# Patient Record
Sex: Male | Born: 1975 | Race: White | Hispanic: No | Smoking: Former smoker
Health system: Southern US, Community
[De-identification: ages and names within clinical notes are randomized; demographics above are authoritative.]

## PROBLEM LIST (undated history)

## (undated) DIAGNOSIS — F419 Anxiety disorder, unspecified: Secondary | ICD-10-CM

## (undated) DIAGNOSIS — S0990XA Unspecified injury of head, initial encounter: Secondary | ICD-10-CM

## (undated) DIAGNOSIS — F17200 Nicotine dependence, unspecified, uncomplicated: Secondary | ICD-10-CM

## (undated) DIAGNOSIS — G43009 Migraine without aura, not intractable, without status migrainosus: Secondary | ICD-10-CM

## (undated) DIAGNOSIS — F902 Attention-deficit hyperactivity disorder, combined type: Secondary | ICD-10-CM

## (undated) DIAGNOSIS — N200 Calculus of kidney: Secondary | ICD-10-CM

## (undated) DIAGNOSIS — J302 Other seasonal allergic rhinitis: Secondary | ICD-10-CM

## (undated) DIAGNOSIS — F339 Major depressive disorder, recurrent, unspecified: Secondary | ICD-10-CM

## (undated) HISTORY — DX: Unspecified injury of head, initial encounter: S09.90XA

## (undated) HISTORY — DX: Migraine without aura, not intractable, without status migrainosus: G43.009

## (undated) HISTORY — DX: Anxiety disorder, unspecified: F41.9

## (undated) HISTORY — DX: Other seasonal allergic rhinitis: J30.2

## (undated) HISTORY — DX: Calculus of kidney: N20.0

## (undated) HISTORY — DX: Nicotine dependence, unspecified, uncomplicated: F17.200

## (undated) HISTORY — DX: Major depressive disorder, recurrent, unspecified: F33.9

## (undated) HISTORY — DX: Attention-deficit hyperactivity disorder, combined type: F90.2

---
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
## 2000-05-08 HISTORY — PX: APPENDECTOMY: SHX54

## 2002-05-08 HISTORY — PX: KNEE SURGERY: SHX244

## 2003-01-28 ENCOUNTER — Encounter: Payer: Self-pay | Admitting: Orthopedic Surgery

## 2003-01-28 ENCOUNTER — Ambulatory Visit (HOSPITAL_COMMUNITY): Admission: RE | Admit: 2003-01-28 | Discharge: 2003-01-28 | Payer: Self-pay | Admitting: Orthopedic Surgery

## 2003-02-04 ENCOUNTER — Ambulatory Visit (HOSPITAL_BASED_OUTPATIENT_CLINIC_OR_DEPARTMENT_OTHER): Admission: RE | Admit: 2003-02-04 | Discharge: 2003-02-04 | Payer: Self-pay | Admitting: Orthopedic Surgery

## 2010-09-23 NOTE — Op Note (Signed)
Ray Rosales, Ray Rosales                            ACCOUNT NO.:  [id number]   MEDICAL RECORD NO.:  [id number]                   PATIENT TYPE:  AMB   LOCATION:  DSC                                  FACILITY:  [facility]   PHYSICIAN:  Mila Homer. Sherlean Foot, M.D.              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   OPERATION PERFORMED:  Right knee arthroscopy with chondroplasty of the  patella and debridement of the patella.   SURGEON:  Mila Homer. Sherlean Foot, M.D.   ASSISTANT:  None.   ANESTHESIA:  General laryngeal mask airway.   INDICATIONS FOR PROCEDURE:  The patient is a 35 year old white male with  mechanical symptoms, MRI evidence of a fissure on the back of the patella  and lots of catching and effusions.  Informed consent was obtained.   DESCRIPTION OF PROCEDURE:  The patient was taken to the operating room and  administered general LMA anesthesia.  The right lower extremity was prepped  and draped in the usual sterile fashion.  Inferolateral and inferomedial  portals were created with a #11 blade, blunt trocar and cannula.  Diagnostic  arthroscopy revealed a large fissure on the central crest in the superior  portion of the patella with a very unstable superior flap of tissue.  There  was a chasm of approximately 1 mm and the unstable cartilage was probably 2  x 10 mm.  The rest of the trochlea and patella were completely normal.  The  medial compartment was pristine as was the lateral compartment, the ACL and  the PCL.  Went back into extension and at this point performed a  chondroplasty through the inferomedial portal removing all of the loose  articular cartilage.  I then used a probe  to continue to delineate the  stable from the unstable cartilage and upon completion of the debridement of  the 4.0 [place] shaver I used an Architect and  without making contact with the cartilage, melted the fibrillating cartilage  back to a very nice smooth surface.  Major contact at this point was in  approximately 45 degrees of flexion and beyond as I do think the patient  will do okay from extension to 45.  After that could be painful and  obviously may need further surgery down the road.   COMPLICATIONS:  None.   DRAINS:  None.   ESTIMATED BLOOD LOSS:  Minimal.  Mila Homer. Sherlean Foot, M.D.   SDL/MEDQ  D:  02/04/2003  T:  02/04/2003  Job:  [id number]

## 2015-09-22 ENCOUNTER — Emergency Department
Admission: EM | Admit: 2015-09-22 | Discharge: 2015-09-22 | Disposition: A | Discharge status: Home / Self Care | Payer: Self-pay | Attending: Emergency Medicine | Admitting: Emergency Medicine

## 2015-09-22 ENCOUNTER — Encounter: Payer: Self-pay | Admitting: Emergency Medicine

## 2015-09-22 DIAGNOSIS — Z87891 Personal history of nicotine dependence: Secondary | ICD-10-CM | POA: Insufficient documentation

## 2015-09-22 DIAGNOSIS — J0100 Acute maxillary sinusitis, unspecified: Secondary | ICD-10-CM | POA: Insufficient documentation

## 2015-09-22 DIAGNOSIS — J029 Acute pharyngitis, unspecified: Secondary | ICD-10-CM | POA: Insufficient documentation

## 2015-09-22 LAB — POCT RAPID STREP A: Streptococcus, Group A Screen (Direct): NEGATIVE

## 2015-09-22 MED ORDER — SULFAMETHOXAZOLE-TRIMETHOPRIM 800-160 MG PO TABS: 1.0000 | ORAL_TABLET | Freq: Two times a day (BID) | ORAL | Status: DC

## 2015-09-22 MED ORDER — PSEUDOEPH-BROMPHEN-DM 30-2-10 MG/5ML PO SYRP: 5.0000 mL | ORAL_SOLUTION | Freq: Four times a day (QID) | ORAL | Status: DC | PRN

## 2015-09-22 MED ORDER — GENTAMICIN SULFATE 0.3 % OP SOLN: 1.0000 [drp] | OPHTHALMIC | Status: DC

## 2015-09-22 NOTE — Discharge Instructions (Signed)

## 2015-09-22 NOTE — ED Provider Notes (Signed)
[facility] Emergency Department Provider Note   ____________________________________________  Time seen: Approximately 9:43 AM  I have reviewed the triage vital signs and the nursing notes.   HISTORY  Chief Complaint Sore Throat and Chest Pain    HPI Ray Rosales is a 40 y.o. male patient complaining of sinus congestion, sore throat, and nonproductive cough for 1 week. Patient also complaining of mid eyelids onset this morning. No palliative measures taken for this complaint. Patient rates his pain discomfort as a 4/10. Patient describes pain as a pressure sensation.   History reviewed. No pertinent past medical history.  There are no active problems to display for this patient.   Past Surgical History  Procedure Laterality Date  . Appendectomy      Current Outpatient Rx  Name  Route  Sig  Dispense  Refill  . brompheniramine-pseudoephedrine-DM 30-2-10 MG/5ML syrup   Oral   Take 5 mLs by mouth 4 (four) times daily as needed.   120 mL   0   . sulfamethoxazole-trimethoprim (BACTRIM DS,SEPTRA DS) 800-160 MG tablet   Oral   Take 1 tablet by mouth 2 (two) times daily.   20 tablet   0     Allergies Amoxicillin and Ultram  No family history on file.  Social History Social History  Substance Use Topics  . Smoking status: Former Games developermoker  . Smokeless tobacco: None  . Alcohol Use: None    Review of Systems Constitutional: No fever/chills Eyes: No visual changes.Matted left eyelid. ZOX:WRUEAENT:Sinus pressure and sore throat.  Cardiovascular: Denies chest pain. Nonproductive cough Respiratory: Denies shortness of breath. Gastrointestinal: No abdominal pain.  No nausea, no vomiting.  No diarrhea.  No constipation. Genitourinary: Negative for dysuria. Musculoskeletal: Negative for back pain. Skin: Negative for rash. Neurological: Positive for frontal headache but denies focal weakness or  numbness.   ____________________________________________   PHYSICAL EXAM:  VITAL SIGNS: ED Triage Vitals  Enc Vitals Group     BP 09/22/15 0922 137/86 mmHg     Pulse Rate 09/22/15 0922 88     Resp 09/22/15 0922 18     Temp 09/22/15 0922 97.8 F (36.6 C)     Temp src --      SpO2 09/22/15 0922 99 %     Weight 09/22/15 0922 147 lb (66.679 kg)     Height 09/22/15 0922 5\' 8"  (1.727 m)     Head Cir --      Peak Flow --      Pain Score 09/22/15 0923 4     Pain Loc --      Pain Edu? --      Excl. in GC? --     Constitutional: Alert and oriented. Well appearing and in no acute distress. Eyes: Conjunctivae are normal. PERRL. EOMI. Head: Atraumatic. Nose: No congestion/rhinnorhea.Bilateral maxillary guarding. Edematous bilateral nasal turbinates. Mouth/Throat: Mucous membranes are moist.  Oropharynx erythematous. Not exudative tonsils Neck: No stridor.  No cervical spine tenderness to palpation. Hematological/Lymphatic/Immunilogical: No cervical lymphadenopathy. Cardiovascular: Normal rate, regular rhythm. Grossly normal heart sounds.  Good peripheral circulation. Respiratory: Normal respiratory effort.  No retractions. Lungs CTAB. Nonproductive cough Gastrointestinal: Soft and nontender. No distention. No abdominal bruits. No CVA tenderness. Musculoskeletal: No lower extremity tenderness nor edema.  No joint effusions. Neurologic:  Normal speech and language. No gross focal neurologic deficits are appreciated. No gait instability. Skin:  Skin is warm, dry and intact. No rash noted. Psychiatric: Mood and affect are normal. Speech and behavior are  normal.  ____________________________________________   LABS (all labs ordered are listed, but only abnormal results are displayed)  Labs Reviewed  POCT RAPID STREP A    ____________________________________________  EKG   ____________________________________________  RADIOLOGY   ____________________________________________   PROCEDURES  Procedure(s) performed: None  Critical Care performed: No  ____________________________________________   INITIAL IMPRESSION / ASSESSMENT AND PLAN / ED COURSE  Pertinent labs & imaging results that were available during my care of the patient were reviewed by me and considered in my medical decision making (see chart for details).  Sinusitis. Patient given discharge care instructions. Patient given prescription for Bactrim DS, gentamicin and Bromfed-DM. ____________________________________________   FINAL CLINICAL IMPRESSION(S) / ED DIAGNOSES  Final diagnoses:  Acute maxillary sinusitis, recurrence not specified  Pharyngitis      NEW MEDICATIONS STARTED DURING THIS VISIT:  New Prescriptions   BROMPHENIRAMINE-PSEUDOEPHEDRINE-DM 30-2-10 MG/5ML SYRUP    Take 5 mLs by mouth 4 (four) times daily as needed.   SULFAMETHOXAZOLE-TRIMETHOPRIM (BACTRIM DS,SEPTRA DS) 800-160 MG TABLET    Take 1 tablet by mouth 2 (two) times daily.     Note:  This document was prepared using Dragon voice recognition software and may include unintentional dictation errors.     Joni Reining, PA-C 09/22/15 4098  Sharman Cheek, MD 09/22/15 [phone]

## 2015-09-22 NOTE — ED Notes (Signed)
Sore throat midsteranl chest tightness x1 week, cough and congestion

## 2015-09-22 NOTE — ED Notes (Signed)
poc quick strep neg

## 2016-02-24 ENCOUNTER — Encounter: Payer: Self-pay | Admitting: *Deleted

## 2016-02-24 ENCOUNTER — Emergency Department
Admission: EM | Admit: 2016-02-24 | Discharge: 2016-02-25 | Disposition: A | Discharge status: Home / Self Care | Payer: Self-pay | Attending: Emergency Medicine | Admitting: Emergency Medicine

## 2016-02-24 DIAGNOSIS — F17210 Nicotine dependence, cigarettes, uncomplicated: Secondary | ICD-10-CM | POA: Insufficient documentation

## 2016-02-24 DIAGNOSIS — K649 Unspecified hemorrhoids: Secondary | ICD-10-CM

## 2016-02-24 DIAGNOSIS — K648 Other hemorrhoids: Secondary | ICD-10-CM | POA: Insufficient documentation

## 2016-02-24 DIAGNOSIS — R109 Unspecified abdominal pain: Secondary | ICD-10-CM

## 2016-02-24 DIAGNOSIS — R1031 Right lower quadrant pain: Secondary | ICD-10-CM | POA: Insufficient documentation

## 2016-02-24 LAB — COMPREHENSIVE METABOLIC PANEL
ALK PHOS: 49 U/L (ref 38–126)
ALT: 16 U/L — AB (ref 17–63)
AST: 20 U/L (ref 15–41)
Albumin: 4.2 g/dL (ref 3.5–5.0)
Anion gap: 7 (ref 5–15)
BILIRUBIN TOTAL: 0.5 mg/dL (ref 0.3–1.2)
BUN: 20 mg/dL (ref 6–20)
CALCIUM: 9.1 mg/dL (ref 8.9–10.3)
CO2: 28 mmol/L (ref 22–32)
CREATININE: 1.43 mg/dL — AB (ref 0.61–1.24)
Chloride: 103 mmol/L (ref 101–111)
GFR calc Af Amer: >60 mL/min (ref >60)
GLUCOSE: 103 mg/dL — AB (ref 65–99)
POTASSIUM: 3.8 mmol/L (ref 3.5–5.1)
Sodium: 138 mmol/L (ref 135–145)
TOTAL PROTEIN: 6.9 g/dL (ref 6.5–8.1)

## 2016-02-24 LAB — URINALYSIS COMPLETE WITH MICROSCOPIC (ARMC ONLY)
BILIRUBIN URINE: NEGATIVE
Bacteria, UA: NONE SEEN
GLUCOSE, UA: NEGATIVE mg/dL
HGB URINE DIPSTICK: NEGATIVE
Ketones, ur: NEGATIVE mg/dL
LEUKOCYTES UA: NEGATIVE
NITRITE: NEGATIVE
Protein, ur: NEGATIVE mg/dL
RBC / HPF: NONE SEEN RBC/hpf (ref 0–5)
SPECIFIC GRAVITY, URINE: 1.018 (ref 1.005–1.030)
Squamous Epithelial / LPF: NONE SEEN
WBC, UA: NONE SEEN WBC/hpf (ref 0–5)
pH: 7 (ref 5.0–8.0)

## 2016-02-24 LAB — CBC
HCT: 42.5 % (ref 40.0–52.0)
Hemoglobin: 14.9 g/dL (ref 13.0–18.0)
MCH: 33.1 pg (ref 26.0–34.0)
MCHC: 35 g/dL (ref 32.0–36.0)
MCV: 94.6 fL (ref 80.0–100.0)
PLATELETS: 210 10*3/uL (ref 150–440)
RBC: 4.49 MIL/uL (ref 4.40–5.90)
RDW: 15 % — AB (ref 11.5–14.5)
WBC: 6.5 10*3/uL (ref 3.8–10.6)

## 2016-02-24 LAB — LIPASE, BLOOD: Lipase: 24 U/L (ref 11–51)

## 2016-02-24 MED ORDER — ONDANSETRON 4 MG PO TBDP
4.0000 mg | ORAL_TABLET | Freq: Once | ORAL | Status: AC | PRN
  Administered 2016-02-24: 4 mg via ORAL
  Filled 2016-02-24: qty 1

## 2016-02-24 MED ORDER — IOPAMIDOL (ISOVUE-300) INJECTION 61%
30.0000 mL | Freq: Once | INTRAVENOUS | Status: AC
  Administered 2016-02-24: 30 mL via ORAL

## 2016-02-24 NOTE — ED Notes (Signed)
Pt. States weakness and intermittent nausea for 1 week. Pt. States abd pain associated 3-4 nagging at rest, 6/10 sharp stabbing in waves. Pt. Denies diarrhea, states 1 episode of vomiting today. Pt. States BM before noon today where "dime-size" blood clot was passed.

## 2016-02-24 NOTE — ED Triage Notes (Signed)
Pt arrived to ED reporting right sided abd pain that radiates to lower back for the past 2 days. Pt reports nausea and 1 episode of vomiting as well as rectal bleeding. Pt describes bleeding as bright red clots that began this evening. Pt reports generalized weakness for the past two days but no fevers and no decrease in appetite.

## 2016-02-24 NOTE — ED Provider Notes (Signed)
[facility] Emergency Department Provider Note   First MD Initiated Contact with Patient 02/24/16 2300     (approximate)  I have reviewed the triage vital signs and the nursing notes.   HISTORY  Chief Complaint Abdominal Pain and Rectal Bleeding   HPI Ray Rosales is a 40 y.o. malewith no chronic medical conditions presents to the emergency department with 2 day history of intermittent right lower quadrant abdominal pain with radiation to the back accompanied by nausea and one episode of vomiting. Patient also admits to bright red blood per rectum which was noted today. Patient admits to generalized weakness. He denies any family history of inflammatory bowel disease. Current pain score 4 out of 10.   Past Medical History:  Diagnosis Date  . Migraine     There are no active problems to display for this patient.   Past Surgical History:  Procedure Laterality Date  . APPENDECTOMY      Prior to Admission medications   Medication Sig Start Date End Date Taking? Authorizing Provider  brompheniramine-pseudoephedrine-DM 30-2-10 MG/5ML syrup Take 5 mLs by mouth 4 (four) times daily as needed. 09/22/15   Joni Reining, PA-C  cyclobenzaprine (FLEXERIL) 10 MG tablet Take 1 tablet (10 mg total) by mouth 3 (three) times daily as needed. 02/25/16   Darci Current, MD  gentamicin (GARAMYCIN) 0.3 % ophthalmic solution Place 1 drop into both eyes every 4 (four) hours. 09/22/15   Joni Reining, PA-C  sulfamethoxazole-trimethoprim (BACTRIM DS,SEPTRA DS) 800-160 MG tablet Take 1 tablet by mouth 2 (two) times daily. 09/22/15   Joni Reining, PA-C    Allergies Amoxicillin and Ultram [tramadol hcl]  No family history on file.  Social History Social History  Substance Use Topics  . Smoking status: Current Every Day Smoker    Packs/day: 0.50    Types: Cigarettes  . Smokeless tobacco: Never Used  . Alcohol use No    Review of Systems Constitutional: No  fever/chills Eyes: No visual changes. ENT: No sore throat. Cardiovascular: Denies chest pain. Respiratory: Denies shortness of breath. Gastrointestinal: Positive for abdominal pain nausea and vomiting.  Genitourinary: Negative for dysuria. Musculoskeletal: Negative for back pain. Skin: Negative for rash. Neurological: Negative for headaches, focal weakness or numbness.  10-point ROS otherwise negative.  ____________________________________________   PHYSICAL EXAM:  VITAL SIGNS: ED Triage Vitals  Enc Vitals Group     BP 02/24/16 2154 (!) 132/92     Pulse Rate 02/24/16 2154 86     Resp 02/24/16 2154 16     Temp 02/24/16 2154 97.7 F (36.5 C)     Temp Source 02/24/16 2154 Oral     SpO2 02/24/16 2154 100 %     Weight 02/24/16 2155 152 lb (68.9 kg)     Height 02/24/16 2155 5\' 7"  (1.702 m)     Head Circumference --      Peak Flow --      Pain Score 02/24/16 2155 3     Pain Loc --      Pain Edu? --      Excl. in GC? --     Constitutional: Alert and oriented. Well appearing and in no acute distress. Eyes: Conjunctivae are normal. PERRL. EOMI. Head: Atraumatic. Mouth/Throat: Mucous membranes are moist.  Oropharynx non-erythematous. Neck: No stridor.  No meningeal signs.  Cardiovascular: Normal rate, regular rhythm. Good peripheral circulation. Grossly normal heart sounds. Respiratory: Normal respiratory effort.  No retractions. Lungs CTAB. Gastrointestinal: Soft and nontender.  No distention. Internal hemorrhoid noted on rectal exam Musculoskeletal: No lower extremity tenderness nor edema. No gross deformities of extremities. Neurologic:  Normal speech and language. No gross focal neurologic deficits are appreciated.  Skin:  Skin is warm, dry and intact. No rash noted. Psychiatric: Mood and affect are normal. Speech and behavior are normal.  ____________________________________________   LABS (all labs ordered are listed, but only abnormal results are displayed)  Labs  Reviewed  COMPREHENSIVE METABOLIC PANEL - Abnormal; Notable for the following:       Result Value   Glucose, Bld 103 (*)    Creatinine, Ser 1.43 (*)    ALT 16 (*)    All other components within normal limits  CBC - Abnormal; Notable for the following:    RDW 15.0 (*)    All other components within normal limits  URINALYSIS COMPLETEWITH MICROSCOPIC ([facility] ONLY) - Abnormal; Notable for the following:    Color, Urine YELLOW (*)    APPearance CLOUDY (*)    All other components within normal limits  LIPASE, BLOOD     RADIOLOGY I, Le[place]worth N Marigny Borre, personally viewed and evaluated these images (plain radiographs) as part of my medical decision making, as well as reviewing the written report by the radiologist.  Ct Abdomen Pelvis W Contrast  Result Date: 02/25/2016 CLINICAL DATA:  Acute onset of right-sided abdominal pain and lower back pain. Vomiting. Rectal bleeding. Initial encounter. EXAM: CT ABDOMEN AND PELVIS WITH CONTRAST TECHNIQUE: Multidetector CT imaging of the abdomen and pelvis was performed using the standard protocol following bolus administration of intravenous contrast. CONTRAST:  ISOVUE-300 IOPAMIDOL (ISOVUE-300) INJECTION 61% COMPARISON:  None. FINDINGS: Lower chest: The visualized lung bases are grossly clear. The visualized portions of the mediastinum are unremarkable. Hepatobiliary: Small hypodensities within the liver, measuring up to 6 mm, are nonspecific but may reflect small cysts. The liver is otherwise unremarkable. The gallbladder is within normal limits. The common bile duct remains normal in caliber. Pancreas: The pancreas is within normal limits. Spleen: The spleen is unremarkable in appearance. Adrenals/Urinary Tract: The adrenal glands are unremarkable in appearance. The kidneys are within normal limits. There is no evidence of hydronephrosis. No renal or ureteral stones are identified. No perinephric stranding is seen. Stomach/Bowel: The stomach is unremarkable  in appearance. The small bowel is within normal limits. The patient is status post appendectomy. The colon is unremarkable in appearance. Mildly prominent vessels are noted about the rectum, of uncertain significance. Vascular/Lymphatic: The abdominal aorta is unremarkable in appearance. The inferior vena cava is grossly unremarkable. No retroperitoneal lymphadenopathy is seen. No pelvic sidewall lymphadenopathy is identified. Reproductive: The bladder is mildly distended and grossly unremarkable. The prostate remains normal in size. Other: No additional soft tissue abnormalities are seen. Musculoskeletal: No acute osseous abnormalities are identified. The visualized musculature is unremarkable in appearance. IMPRESSION: 1. No acute abnormality seen to explain the patient's symptoms. 2. Mildly prominent vasculature noted about the rectum. Would correlate for any clinical evidence of hemorrhoids. 3. Small nonspecific hypodensities within the liver, measuring up to 6 mm, may reflect small cysts. Electronically Signed   By: Roanna Raider M.D.   On: 02/25/2016 01:30    ____________ Procedures     INITIAL IMPRESSION / ASSESSMENT AND PLAN / ED COURSE  Pertinent labs & imaging results that were available during my care of the patient were reviewed by me and considered in my medical decision making (see chart for details).     Clinical Course  ____________________________________________  FINAL CLINICAL IMPRESSION(S) / ED DIAGNOSES  Final diagnoses:  Right flank pain  Hemorrhoids, unspecified hemorrhoid type     MEDICATIONS GIVEN DURING THIS VISIT:  Medications  ketorolac (TORADOL) 30 MG/ML injection 30 mg (30 mg Intravenous Refused 02/25/16 0043)  ondansetron (ZOFRAN-ODT) disintegrating tablet 4 mg (4 mg Oral Given 02/24/16 2201)  iopamidol (ISOVUE-300) 61 % injection 30 mL (30 mLs Oral Contrast Given 02/24/16 2325)  iopamidol (ISOVUE-300) 61 % injection 100 mL (100 mLs Intravenous  Contrast Given 02/25/16 0024)  ibuprofen (ADVIL,MOTRIN) tablet 600 mg (600 mg Oral Given 02/25/16 0102)     NEW OUTPATIENT MEDICATIONS STARTED DURING THIS VISIT:  New Prescriptions   CYCLOBENZAPRINE (FLEXERIL) 10 MG TABLET    Take 1 tablet (10 mg total) by mouth 3 (three) times daily as needed.    Modified Medications   No medications on file    Discontinued Medications   No medications on file     Note:  This document was prepared using Dragon voice recognition software and may include unintentional dictation errors.    Darci Currentandolph N Joua Bake, MD 02/25/16 [phone]

## 2016-02-25 ENCOUNTER — Encounter: Payer: Self-pay | Admitting: Radiology

## 2016-02-25 ENCOUNTER — Emergency Department: Payer: Self-pay

## 2016-02-25 MED ORDER — IBUPROFEN 600 MG PO TABS
ORAL_TABLET | ORAL | Status: AC
  Administered 2016-02-25: 600 mg via ORAL
  Filled 2016-02-25: qty 1

## 2016-02-25 MED ORDER — KETOROLAC TROMETHAMINE 30 MG/ML IJ SOLN: 30.0000 mg | Freq: Once | INTRAMUSCULAR | Status: DC

## 2016-02-25 MED ORDER — KETOROLAC TROMETHAMINE 30 MG/ML IJ SOLN
INTRAMUSCULAR | Status: AC
  Filled 2016-02-25: qty 1

## 2016-02-25 MED ORDER — IBUPROFEN 600 MG PO TABS
600.0000 mg | ORAL_TABLET | Freq: Once | ORAL | Status: AC
  Administered 2016-02-25: 600 mg via ORAL

## 2016-02-25 MED ORDER — CYCLOBENZAPRINE HCL 10 MG PO TABS: 10.0000 mg | ORAL_TABLET | Freq: Three times a day (TID) | ORAL | 0 refills | Status: DC | PRN

## 2016-02-25 MED ORDER — IOPAMIDOL (ISOVUE-300) INJECTION 61%
100.0000 mL | Freq: Once | INTRAVENOUS | Status: AC | PRN
  Administered 2016-02-25: 100 mL via INTRAVENOUS

## 2016-02-25 NOTE — ED Notes (Signed)

## 2016-02-25 NOTE — ED Notes (Addendum)
MD Manson PasseyBrown at bedside for follow-up and review of assessments/imaging.

## 2016-02-25 NOTE — ED Notes (Signed)
Pt. Returned to tx. room in stable condition with no acute changes since departure from unit for scans.   

## 2016-05-08 ENCOUNTER — Emergency Department: Payer: Commercial Managed Care - PPO

## 2016-05-08 ENCOUNTER — Encounter: Payer: Self-pay | Admitting: Emergency Medicine

## 2016-05-08 ENCOUNTER — Emergency Department
Admission: EM | Admit: 2016-05-08 | Discharge: 2016-05-08 | Disposition: A | Discharge status: Home / Self Care | Payer: Commercial Managed Care - PPO | Attending: Emergency Medicine | Admitting: Emergency Medicine

## 2016-05-08 DIAGNOSIS — J069 Acute upper respiratory infection, unspecified: Secondary | ICD-10-CM

## 2016-05-08 DIAGNOSIS — F17210 Nicotine dependence, cigarettes, uncomplicated: Secondary | ICD-10-CM | POA: Insufficient documentation

## 2016-05-08 DIAGNOSIS — B9789 Other viral agents as the cause of diseases classified elsewhere: Secondary | ICD-10-CM

## 2016-05-08 DIAGNOSIS — R05 Cough: Secondary | ICD-10-CM | POA: Diagnosis present

## 2016-05-08 DIAGNOSIS — R55 Syncope and collapse: Secondary | ICD-10-CM | POA: Diagnosis not present

## 2016-05-08 LAB — CBC
HEMATOCRIT: 44.1 % (ref 40.0–52.0)
Hemoglobin: 15 g/dL (ref 13.0–18.0)
MCH: 32.3 pg (ref 26.0–34.0)
MCHC: 34 g/dL (ref 32.0–36.0)
MCV: 95.1 fL (ref 80.0–100.0)
PLATELETS: 259 10*3/uL (ref 150–440)
RBC: 4.64 MIL/uL (ref 4.40–5.90)
RDW: 14.7 % — AB (ref 11.5–14.5)
WBC: 7.3 10*3/uL (ref 3.8–10.6)

## 2016-05-08 LAB — BASIC METABOLIC PANEL
Anion gap: 6 (ref 5–15)
BUN: 17 mg/dL (ref 6–20)
CHLORIDE: 103 mmol/L (ref 101–111)
CO2: 28 mmol/L (ref 22–32)
CREATININE: 1.08 mg/dL (ref 0.61–1.24)
Calcium: 8.4 mg/dL — ABNORMAL LOW (ref 8.9–10.3)
GFR calc Af Amer: >60 mL/min (ref >60)
GFR calc non Af Amer: >60 mL/min (ref >60)
Glucose, Bld: 112 mg/dL — ABNORMAL HIGH (ref 65–99)
POTASSIUM: 3.8 mmol/L (ref 3.5–5.1)
Sodium: 137 mmol/L (ref 135–145)

## 2016-05-08 LAB — URINALYSIS, COMPLETE (UACMP) WITH MICROSCOPIC
BACTERIA UA: NONE SEEN
BILIRUBIN URINE: NEGATIVE
Glucose, UA: 50 mg/dL — AB
Hgb urine dipstick: NEGATIVE
KETONES UR: NEGATIVE mg/dL
LEUKOCYTES UA: NEGATIVE
Nitrite: NEGATIVE
PH: 8 (ref 5.0–8.0)
Protein, ur: NEGATIVE mg/dL
RBC / HPF: NONE SEEN RBC/hpf (ref 0–5)
Specific Gravity, Urine: 1.016 (ref 1.005–1.030)

## 2016-05-08 LAB — TROPONIN I: Troponin I: <0.03 ng/mL (ref <0.03)

## 2016-05-08 MED ORDER — SODIUM CHLORIDE 0.9 % IV BOLUS (SEPSIS)
1000.0000 mL | Freq: Once | INTRAVENOUS | Status: AC
  Administered 2016-05-08: 1000 mL via INTRAVENOUS

## 2016-05-08 MED ORDER — IPRATROPIUM-ALBUTEROL 0.5-2.5 (3) MG/3ML IN SOLN
3.0000 mL | Freq: Once | RESPIRATORY_TRACT | Status: AC
  Administered 2016-05-08: 3 mL via RESPIRATORY_TRACT

## 2016-05-08 MED ORDER — HYDROCOD POLST-CPM POLST ER 10-8 MG/5ML PO SUER
5.0000 mL | Freq: Once | ORAL | Status: AC
Start: 2016-05-08 — End: 2016-05-08
  Administered 2016-05-08: 5 mL via ORAL
  Filled 2016-05-08: qty 5

## 2016-05-08 MED ORDER — HYDROCOD POLST-CPM POLST ER 10-8 MG/5ML PO SUER: 5.0000 mL | Freq: Two times a day (BID) | ORAL | 0 refills | Status: DC | PRN

## 2016-05-08 NOTE — ED Notes (Signed)
Called pharmacy to send duoneb

## 2016-05-08 NOTE — ED Provider Notes (Addendum)
[facility] Emergency Department Provider Note  ____________________________________________   I have reviewed the triage vital signs and the nursing notes.   HISTORY  Chief Complaint Nasal Congestion; Cough; and Loss of Consciousness    HPI Ray Rosales is a 41 y.o. male who is a baseline healthy. He had a coughing fit today and passed out or nearly did for "a second" no seizure activity did not hit his head feels back to baseline today. His primary preoccupation is that he still has a cough. He has URI symptoms for weight. Child is actually improving. He has not had a fever in several days. He feels he is getting better he just had a "bad coughing fit". He does not have a history of early cardiac death in his family, no known history ofprolonged QT or other cardiogenic pathology. At this time he has no complaints except for he is tired of coughing he states. He is not having any other shortness of breath. He has no history of long QT syndrome, he has no other complaints at this time. There is no indication for antibiotics       Past Medical History:  Diagnosis Date  . Migraine     There are no active problems to display for this patient.   Past Surgical History:  Procedure Laterality Date  . APPENDECTOMY      Prior to Admission medications   Medication Sig Start Date End Date Taking? Authorizing Provider  brompheniramine-pseudoephedrine-DM 30-2-10 MG/5ML syrup Take 5 mLs by mouth 4 (four) times daily as needed. 09/22/15   Joni Reining, PA-C  cyclobenzaprine (FLEXERIL) 10 MG tablet Take 1 tablet (10 mg total) by mouth 3 (three) times daily as needed. 02/25/16   Darci Current, MD  gentamicin (GARAMYCIN) 0.3 % ophthalmic solution Place 1 drop into both eyes every 4 (four) hours. 09/22/15   Joni Reining, PA-C  sulfamethoxazole-trimethoprim (BACTRIM DS,SEPTRA DS) 800-160 MG tablet Take 1 tablet by mouth 2 (two) times daily. 09/22/15   Joni Reining,  PA-C    Allergies Amoxicillin and Ultram [tramadol hcl]  No family history on file.  Social History Social History  Substance Use Topics  . Smoking status: Current Every Day Smoker    Packs/day: 0.50    Types: Cigarettes  . Smokeless tobacco: Never Used  . Alcohol use No    Review of Systems }Constitutional: No fever/chillsIn several days Eyes: No visual changes. ENT: No sore throat. No stiff neck no neck pain Cardiovascular: Denies chest pain. Respiratory: Denies shortness of breath. Positive cough Gastrointestinal:   no vomiting.  No diarrhea.  No constipation. Genitourinary: Negative for dysuria. Musculoskeletal: Negative lower extremity swelling Skin: Negative for rash. Neurological: Negative for severe headaches, focal weakness or numbness. 10-point ROS otherwise negative.  ____________________________________________   PHYSICAL EXAM:  VITAL SIGNS: ED Triage Vitals [05/08/16 1654]  Enc Vitals Group     BP (!) 141/92     Pulse Rate 90     Resp 14     Temp 98.5 F (36.9 C)     Temp Source Oral     SpO2 100 %     Weight 160 lb (72.6 kg)     Height 5\' 7"  (1.702 m)     Head Circumference      Peak Flow      Pain Score 4     Pain Loc      Pain Edu?      Excl. in GC?  Constitutional: Alert and oriented. Well appearing and in no acute distress. Eyes: Conjunctivae are normal. PERRL. EOMI. Head: Atraumatic. Nose: No congestion/rhinnorhea. Mouth/Throat: Mucous membranes are moist.  Oropharynx non-erythematous. Neck: No stridor.   Nontender with no meningismus Cardiovascular: Normal rate, regular rhythm. Grossly normal heart sounds.  Good peripheral circulation. Respiratory: Normal respiratory effort.  No retractions. Lungs CTAB. Abdominal: Soft and nontender. No distention. No guarding no rebound Back:  There is no focal tenderness or step off.  there is no midline tenderness there are no lesions noted. there is no CVA tenderness Musculoskeletal: No  lower extremity tenderness, no upper extremity tenderness. No joint effusions, no DVT signs strong distal pulses no edema Neurologic:  Normal speech and language. No gross focal neurologic deficits are appreciated.  Skin:  Skin is warm, dry and intact. No rash noted. Psychiatric: Mood and affect are normal. Speech and behavior are normal.  ____________________________________________   LABS (all labs ordered are listed, but only abnormal results are displayed)  Labs Reviewed  BASIC METABOLIC PANEL - Abnormal; Notable for the following:       Result Value   Glucose, Bld 112 (*)    Calcium 8.4 (*)    All other components within normal limits  CBC - Abnormal; Notable for the following:    RDW 14.7 (*)    All other components within normal limits  URINALYSIS, COMPLETE (UACMP) WITH MICROSCOPIC - Abnormal; Notable for the following:    Color, Urine STRAW (*)    APPearance CLEAR (*)    Glucose, UA 50 (*)    Squamous Epithelial / LPF 0-5 (*)    All other components within normal limits  TROPONIN I   ____________________________________________  EKG  I personally interpreted any EKGs ordered by me or triage Normal sinus rhythm rate 91 bpm no acute ST elevation no acute ST depression and will EKG QTC 428 ____________________________________________  RADIOLOGY  I reviewed any imaging ordered by me or triage that were performed during my shift and, if possible, patient and/or family made aware of any abnormal findings. ____________________________________________   PROCEDURES  Procedure(s) performed: None  Procedures  Critical Care performed: None  ____________________________________________   INITIAL IMPRESSION / ASSESSMENT AND PLAN / ED COURSE  Pertinent labs & imaging results that were available during my care of the patient were reviewed by me and considered in my medical decision making (see chart for details).  He should have what appears to be a vasovagal syncope  in the context of a cough. He is very well-appearing here. We have given him IV fluids. His lungs are clear chest x-ray is reassuring blood work is reassuring no evidence of cardiogenic pathology. We will discharge the patient home with close outpatient follow-up. Return precautions given and understood. Patient requesting Tussionex for his cough. We will give him a short course of that but otherwise patient is in no acute distress and we will discharge him.  Clinical Course    ____________________________________________   FINAL CLINICAL IMPRESSION(S) / ED DIAGNOSES  Final diagnoses:  None      This chart was dictated using voice recognition software.  Despite best efforts to proofread,  errors can occur which can change meaning.      Jeanmarie PlantJames A Desmen Schoffstall, MD 05/08/16 2149    Jeanmarie PlantJames A Awad Gladd, MD 05/08/16 2150

## 2016-05-08 NOTE — ED Triage Notes (Signed)
Patient presents to the ED with syncopal episode approx. 1 hour prior to arrival.  Patient reports slight dizziness at this time.  Patient reports 1 week of congestion and cough with sore throat.  Patient states syncopal episode was preceded by cough.  Patient was unconscious for a few seconds according to witness.

## 2016-05-12 ENCOUNTER — Ambulatory Visit (INDEPENDENT_AMBULATORY_CARE_PROVIDER_SITE_OTHER): Payer: Commercial Managed Care - PPO | Admitting: Family Medicine

## 2016-05-12 ENCOUNTER — Encounter: Payer: Self-pay | Admitting: Family Medicine

## 2016-05-12 VITALS — BP 146/105 | HR 148 | Temp 98.8°F | Resp 14 | Ht 67.0 in | Wt 159.6 lb

## 2016-05-12 DIAGNOSIS — Z130 Encounter for screening for diseases of the blood and blood-forming organs and certain disorders involving the immune mechanism: Secondary | ICD-10-CM | POA: Diagnosis not present

## 2016-05-12 DIAGNOSIS — M79641 Pain in right hand: Secondary | ICD-10-CM | POA: Diagnosis not present

## 2016-05-12 DIAGNOSIS — M79642 Pain in left hand: Secondary | ICD-10-CM

## 2016-05-12 DIAGNOSIS — R030 Elevated blood-pressure reading, without diagnosis of hypertension: Secondary | ICD-10-CM

## 2016-05-12 DIAGNOSIS — Z13220 Encounter for screening for lipoid disorders: Secondary | ICD-10-CM

## 2016-05-12 MED ORDER — MELOXICAM 15 MG PO TABS
15.0000 mg | ORAL_TABLET | Freq: Every day | ORAL | 1 refills | Status: DC
Start: 2016-05-12 — End: 2016-06-16

## 2016-05-12 NOTE — Progress Notes (Signed)
Pre visit review using our clinic review tool, if applicable. No additional management support is needed unless otherwise documented below in the visit note. 

## 2016-05-12 NOTE — Patient Instructions (Signed)
Take the medication daily as needed.  We will call with your results.  Take care  Dr. Adriana Simasook   Follow up pending work up.

## 2016-05-15 DIAGNOSIS — M79641 Pain in right hand: Secondary | ICD-10-CM | POA: Insufficient documentation

## 2016-05-15 DIAGNOSIS — M79642 Pain in left hand: Principal | ICD-10-CM

## 2016-05-15 NOTE — Assessment & Plan Note (Signed)
New problem (to me). Unclear etiology at this time. Obtaining xrays of the hands. Starting on Mobic. Labs today.

## 2016-05-15 NOTE — Progress Notes (Signed)
Subjective:  Patient ID: Ray Rosales, male    DOB: Jan 04, 1976  Age: 41 y.o. MRN: [id number]  CC: Hand pain  HPI Ray Rosales is a 41 y.o. male presents to the clinic today with the above complaints.  Hand pain  Reports bilateral hand pain x 3 years.  Worsening recently.   R>L. Moderate in severity.  Affects the middle and ring fingers predominantly.  He reports swelling in the am.  Worse with activity.  Associated decrease in grip strength.  No reported fall, trauma, injury.   He has used Ibuprofen with little improvement.   PMH, Surgical Hx, Family Hx, Social History reviewed and updated as below.  Past Medical History:  Diagnosis Date  . Allergy   . Head injury   . Kidney stones   . Migraine    Past Surgical History:  Procedure Laterality Date  . APPENDECTOMY    . KNEE SURGERY Right    Family History  Problem Relation Age of Onset  . Arthritis Mother   . Depression Mother   . Hypertension Father   . ALS Maternal Grandmother   . Mental illness Maternal Grandfather   . Lung cancer Paternal Grandmother   . Bone cancer Paternal Grandmother   . Alcohol abuse Paternal Grandfather    Social History  Substance Use Topics  . Smoking status: Current Every Day Smoker    Packs/day: 0.50    Types: Cigarettes  . Smokeless tobacco: Never Used  . Alcohol use 0.0 - 0.6 oz/week   Review of Systems  Constitutional:       Fever from recent illness.  Respiratory: Positive for cough.   Musculoskeletal:       Bilateral hand pain.  Neurological: Positive for headaches.  Psychiatric/Behavioral:       Stress.   All other systems reviewed and are negative.  Objective:   Today's Vitals: BP (!) 146/105   Pulse (!) 148   Temp 98.8 F (37.1 C) (Oral)   Resp 14   Ht 5\' 7"  (1.702 m)   Wt 159 lb 9.6 oz (72.4 kg)   SpO2 99%   BMI 25.00 kg/m   Physical Exam  Constitutional: He is oriented to person, place, and time. He appears well-developed. No distress.    HENT:  Mouth/Throat: Oropharynx is clear and moist.  Eyes: Conjunctivae are normal.  Neck: Neck supple.  Cardiovascular:  Tachycardic. Regular rhythm.   Pulmonary/Chest: Effort normal and breath sounds normal.  Abdominal: Soft. He exhibits no distension. There is no tenderness. There is no rebound and no guarding.  Musculoskeletal:  Bilateral hands - no apparent synovitis. No joint tenderness. Negative Tinel's and Phalen's.   Neurological: He is alert and oriented to person, place, and time.  Skin: No rash noted.  Psychiatric:  Anxious. Flat affect.  Vitals reviewed.  Assessment & Plan:   Problem List Items Addressed This Visit    Bilateral hand pain - Primary    New problem (to me). Unclear etiology at this time. Obtaining xrays of the hands. Starting on Mobic. Labs today.      Relevant Orders   DG Hand 2 View Left   DG Hand 2 View Right    Other Visit Diagnoses    Elevated BP without diagnosis of hypertension       Relevant Orders   Comprehensive metabolic panel   Screening for deficiency anemia       Relevant Orders   CBC   Screening, lipid  Relevant Orders   Lipid panel      Outpatient Encounter Prescriptions as of 05/12/2016  Medication Sig  . meloxicam (MOBIC) 15 MG tablet Take 1 tablet (15 mg total) by mouth daily.  . [DISCONTINUED] brompheniramine-pseudoephedrine-DM 30-2-10 MG/5ML syrup Take 5 mLs by mouth 4 (four) times daily as needed.  . [DISCONTINUED] chlorpheniramine-HYDROcodone (TUSSIONEX PENNKINETIC ER) 10-8 MG/5ML SUER Take 5 mLs by mouth every 12 (twelve) hours as needed for cough.  . [DISCONTINUED] cyclobenzaprine (FLEXERIL) 10 MG tablet Take 1 tablet (10 mg total) by mouth 3 (three) times daily as needed.  . [DISCONTINUED] gentamicin (GARAMYCIN) 0.3 % ophthalmic solution Place 1 drop into both eyes every 4 (four) hours.  . [DISCONTINUED] sulfamethoxazole-trimethoprim (BACTRIM DS,SEPTRA DS) 800-160 MG tablet Take 1 tablet by mouth 2 (two)  times daily.   No facility-administered encounter medications on file as of 05/12/2016.     Follow-up: Unsure (patient left office before AVS was given)  Everlene Other DO [facility]

## 2016-05-17 ENCOUNTER — Encounter: Payer: Self-pay | Admitting: *Deleted

## 2016-05-17 ENCOUNTER — Ambulatory Visit
Admission: EM | Admit: 2016-05-17 | Discharge: 2016-05-17 | Disposition: A | Discharge status: Home / Self Care | Payer: Commercial Managed Care - PPO | Attending: Family Medicine | Admitting: Family Medicine

## 2016-05-17 DIAGNOSIS — K0889 Other specified disorders of teeth and supporting structures: Secondary | ICD-10-CM | POA: Diagnosis not present

## 2016-05-17 DIAGNOSIS — K0510 Chronic gingivitis, plaque induced: Secondary | ICD-10-CM

## 2016-05-17 MED ORDER — CHLORHEXIDINE GLUCONATE 0.12% ORAL RINSE (MEDLINE KIT): 15.0000 mL | Freq: Two times a day (BID) | OROMUCOSAL | 0 refills | Status: DC

## 2016-05-17 MED ORDER — CLINDAMYCIN HCL 150 MG PO CAPS: 150.0000 mg | ORAL_CAPSULE | Freq: Four times a day (QID) | ORAL | 0 refills | Status: DC

## 2016-05-17 NOTE — ED Triage Notes (Signed)
Patient started having left upper tooth pain 2 days ago.

## 2016-05-17 NOTE — ED Provider Notes (Signed)
CSN: [id number]     Arrival date & time 05/17/16  1517 History   First MD Initiated Contact with Patient 05/17/16 1655     Chief Complaint  Patient presents with  . Dental Pain   (Consider location/radiation/quality/duration/timing/severity/associated sxs/prior Treatment) HPI: left upper premolar pain x 2 days. Denies fever/chills  Past Medical History:  Diagnosis Date  . Allergy   . Head injury   . Kidney stones   . Migraine    Past Surgical History:  Procedure Laterality Date  . APPENDECTOMY    . KNEE SURGERY Right    Family History  Problem Relation Age of Onset  . Arthritis Mother   . Depression Mother   . Hypertension Father   . ALS Maternal Grandmother   . Mental illness Maternal Grandfather   . Lung cancer Paternal Grandmother   . Bone cancer Paternal Grandmother   . Alcohol abuse Paternal Grandfather    Social History  Substance Use Topics  . Smoking status: Current Every Day Smoker    Packs/day: 0.50    Types: Cigarettes  . Smokeless tobacco: Never Used  . Alcohol use 0.0 - 0.6 oz/week    Review of Systems  HENT: Positive for dental problem.     Allergies  Amoxicillin; Toradol [ketorolac tromethamine]; and Ultram [tramadol hcl]  Home Medications   Prior to Admission medications   Medication Sig Start Date End Date Taking? Authorizing Provider  chlorhexidine gluconate, MEDLINE KIT, (PERIDEX) 0.12 % solution Use as directed 15 mLs in the mouth or throat 2 (two) times daily. 05/17/16   Evadne Ose, NP  clindamycin (CLEOCIN) 150 MG capsule Take 1 capsule (150 mg total) by mouth every 6 (six) hours. 05/17/16   Taurus Alamo, NP  meloxicam (MOBIC) 15 MG tablet Take 1 tablet (15 mg total) by mouth daily. 05/12/16   Coral Spikes, DO   Meds Ordered and Administered this Visit  Medications - No data to display  BP (!) 146/101 (BP Location: Left Arm)   Pulse 65   Temp 98.1 F (36.7 C) (Oral)   Resp 16   Ht 5' 7"  (1.702 m)   Wt 159 lb (72.1 kg)    SpO2 100%   BMI 24.90 kg/m  No data found.   Physical Exam  HENT:  Mouth/Throat: Abnormal dentition.  Poor dentition with multiple missing teeth. Left upper gingival swelling and tenderness to palpation.  Urgent Care Course   Clinical Course     Procedures (including critical care time)  Labs Review Labs Reviewed - No data to display  Imaging Review No results found.   Visual Acuity Review  Right Eye Distance:   Left Eye Distance:   Bilateral Distance:    Right Eye Near:   Left Eye Near:    Bilateral Near:         MDM   1. Pain, dental   2. Gingivitis   Recommended to FU with dentist for further management.    Levita Monical, NP 05/17/16 1705

## 2016-06-15 ENCOUNTER — Ambulatory Visit (INDEPENDENT_AMBULATORY_CARE_PROVIDER_SITE_OTHER): Payer: Commercial Managed Care - PPO | Admitting: Family Medicine

## 2016-06-15 ENCOUNTER — Encounter: Payer: Self-pay | Admitting: Family Medicine

## 2016-06-15 ENCOUNTER — Encounter: Payer: Self-pay | Admitting: Emergency Medicine

## 2016-06-15 VITALS — BP 184/120 | HR 122 | Temp 98.8°F | Resp 18 | Wt 161.6 lb

## 2016-06-15 DIAGNOSIS — R030 Elevated blood-pressure reading, without diagnosis of hypertension: Secondary | ICD-10-CM | POA: Diagnosis not present

## 2016-06-15 DIAGNOSIS — R05 Cough: Secondary | ICD-10-CM

## 2016-06-15 DIAGNOSIS — R059 Cough, unspecified: Secondary | ICD-10-CM

## 2016-06-15 NOTE — Progress Notes (Signed)
Pre visit review using our clinic review tool, if applicable. No additional management support is needed unless otherwise documented below in the visit note. 

## 2016-06-16 ENCOUNTER — Telehealth: Payer: Self-pay | Admitting: Family Medicine

## 2016-06-16 NOTE — Telephone Encounter (Signed)
Patient called to return your call.

## 2016-06-16 NOTE — Telephone Encounter (Signed)
Call in medication to pharmacy. Notified patient. Patient would like to switch to you as PCP. Will this be ok

## 2016-06-16 NOTE — Telephone Encounter (Signed)
Okay to call in cheratussin to take 5 mL po q 4 hours. It is the only cough syrup that he will not have to pick up. Make sure that he has no codeine allergy. #120 mL.

## 2016-06-16 NOTE — Telephone Encounter (Signed)
Yes

## 2016-06-16 NOTE — Telephone Encounter (Signed)
LM for patient to return call.

## 2016-06-16 NOTE — Telephone Encounter (Signed)
Patient was advised to call back if not feeling better, cough has gotten worse. Needs medicine as soon as possible. Please call patient back at [phone] to get rx filled.  Thank you

## 2016-06-16 NOTE — Progress Notes (Signed)
   History of Present Illness:    Ray Rosales is a 41 y.o. male here for evaluation of a cough. The cough is non-productive, without wheezing, dyspnea or hemoptysis and is aggravated by reclining position. Onset of symptoms was 5 days ago, rapidly improving since that time.  Associated symptoms include postnasal drip. Patient does not have a history of asthma. Patient has not had recent travel. Patient does have a history of smoking.   PMHx, SurgHx, SocialHx, Medications, and Allergies were reviewed in the Visit Navigator and updated as appropriate.   Past Medical History:  Diagnosis Date  . Allergy   . Head injury   . Kidney stones   . Migraine    Past Surgical History:  Procedure Laterality Date  . APPENDECTOMY    . KNEE SURGERY Right       Family History  Problem Relation Age of Onset  . Arthritis Mother   . Depression Mother   . Hypertension Father   . ALS Maternal Grandmother   . Mental illness Maternal Grandfather   . Lung cancer Paternal Grandmother   . Bone cancer Paternal Grandmother   . Alcohol abuse Paternal Grandfather    Social History  Substance Use Topics  . Smoking status: Current Every Day Smoker    Packs/day: 0.50    Types: Cigarettes  . Smokeless tobacco: Never Used  . Alcohol use 0.0 - 0.6 oz/week     Allergies  Allergen Reactions  . Amoxicillin Hives  . Toradol [Ketorolac Tromethamine]   . Ultram [Tramadol Hcl] Hives    Prior to Admission medications   Not on File     Review of Systems:   No unusual headaches, no dizziness. No dyspnea or chest pain on exertion. No abdominal pain, change in bowel habits, black or bloody stools.  No urinary tract symptoms. No new or unusual musculoskeletal symptoms. No edema.   Vitals:   06/15/16 1413  BP: (!) 184/120  Pulse: (!) 122  Resp: 18  Temp: 98.8 F (37.1 C)  TempSrc: Oral  SpO2: 98%  Weight: 161 lb 9.6 oz (73.3 kg)     Body mass index is 25.31 kg/m.   Physical Exam:   General  appearance: alert, cooperative and appears stated age HEENT: extra ocular movement intact and neck supple with midline trachea Lungs: clear to auscultation bilaterally Heart: tachy but reg, rate low 100s, S1, S2 normal, no murmur, click, rub or gallop Abdomen: soft, non-tender; bowel sounds normal; no masses,  no organomegaly Extremities: extremities normal, atraumatic, no cyanosis or edema Pulses: 2+ and symmetric Skin: Skin color, texture, turgor normal. No rashes or lesions Neurologic: Grossly normal  Assessment and Plan:   Ray Rosales was seen today for cough.  Diagnoses and all orders for this visit:  Elevated BP without diagnosis of hypertension Comments: Patient anxious during exam. Hx of white coat HTN. He will monitor at home. Red flags reviewed.   Cough Comments: Improving. Patient needs note to go back to work today. He will call if there are any changes or concerns.  . Reviewed expectations re: course of current medical issues. . Discussed self-management of symptoms. . Outlined signs and symptoms indicating need for more acute intervention. . Patient verbalized understanding and all questions were answered. . See orders for this visit as documented in the electronic medical record. . Patient received an After Visit Summary.   Helane RimaErica Harlyn Rathmann, D.O.

## 2016-06-16 NOTE — Telephone Encounter (Signed)
Spoke with patient and his cough has gotten worse since visit. Cough is keeping him up at night and hurting his chest with cough. No fever. Patient would like something for cough.

## 2016-06-20 NOTE — Telephone Encounter (Signed)
error 

## 2016-06-20 NOTE — Telephone Encounter (Signed)
LM on patient voicemail that it was ok to establish care with Dr. Earlene PlaterWallace.

## 2016-06-20 NOTE — Telephone Encounter (Signed)
Error

## 2016-07-17 ENCOUNTER — Emergency Department
Admission: EM | Admit: 2016-07-17 | Discharge: 2016-07-17 | Disposition: A | Discharge status: Home / Self Care | Payer: Commercial Managed Care - PPO | Attending: Emergency Medicine | Admitting: Emergency Medicine

## 2016-07-17 ENCOUNTER — Encounter: Payer: Self-pay | Admitting: Emergency Medicine

## 2016-07-17 DIAGNOSIS — Y999 Unspecified external cause status: Secondary | ICD-10-CM | POA: Diagnosis not present

## 2016-07-17 DIAGNOSIS — S0101XA Laceration without foreign body of scalp, initial encounter: Secondary | ICD-10-CM | POA: Insufficient documentation

## 2016-07-17 DIAGNOSIS — W01198A Fall on same level from slipping, tripping and stumbling with subsequent striking against other object, initial encounter: Secondary | ICD-10-CM | POA: Insufficient documentation

## 2016-07-17 DIAGNOSIS — Y929 Unspecified place or not applicable: Secondary | ICD-10-CM | POA: Insufficient documentation

## 2016-07-17 DIAGNOSIS — F17210 Nicotine dependence, cigarettes, uncomplicated: Secondary | ICD-10-CM | POA: Insufficient documentation

## 2016-07-17 DIAGNOSIS — Y939 Activity, unspecified: Secondary | ICD-10-CM | POA: Diagnosis not present

## 2016-07-17 DIAGNOSIS — S0990XA Unspecified injury of head, initial encounter: Secondary | ICD-10-CM | POA: Diagnosis present

## 2016-07-17 MED ORDER — IBUPROFEN 600 MG PO TABS
600.0000 mg | ORAL_TABLET | Freq: Once | ORAL | Status: AC
  Administered 2016-07-17: 600 mg via ORAL
  Filled 2016-07-17: qty 1

## 2016-07-17 NOTE — ED Triage Notes (Signed)
States he fell forward and hit wall    Laceration to top of head  Denies any LO

## 2016-07-17 NOTE — ED Provider Notes (Signed)
[facility] Emergency Department Provider Note   ____________________________________________   None    (approximate)  I have reviewed the triage vital signs and the nursing notes.   HISTORY  Chief Complaint Laceration    HPI Ray Rosales is a 41 y.o. male patient complaining of laceration to the top of his scalp secondary to a trip and fall. Patient state there is no loss of conscious. Patient hit his head against the wall. Patient stated there was profuse bleeding. State bleeding was controlled direct pressure. No other palliative measures for complaint  patient rates his pain as a 6/10.   Past Medical History:  Diagnosis Date  . Allergy   . Head injury   . Kidney stones   . Migraine     Patient Active Problem List   Diagnosis Date Noted  . Bilateral hand pain 05/15/2016    Past Surgical History:  Procedure Laterality Date  . APPENDECTOMY    . KNEE SURGERY Right     Prior to Admission medications   Not on File    Allergies Amoxicillin; Toradol [ketorolac tromethamine]; and Ultram [tramadol hcl]  Family History  Problem Relation Age of Onset  . Arthritis Mother   . Depression Mother   . Hypertension Father   . ALS Maternal Grandmother   . Mental illness Maternal Grandfather   . Lung cancer Paternal Grandmother   . Bone cancer Paternal Grandmother   . Alcohol abuse Paternal Grandfather     Social History Social History  Substance Use Topics  . Smoking status: Current Every Day Smoker    Packs/day: 0.50    Types: Cigarettes  . Smokeless tobacco: Never Used  . Alcohol use 0.0 - 0.6 oz/week    Review of Systems Constitutional: No fever/chills Eyes: No visual changes. ENT: No sore throat. Cardiovascular: Denies chest pain. Respiratory: Denies shortness of breath. Gastrointestinal: No abdominal pain.  No nausea, no vomiting.  No diarrhea.  No constipation. Genitourinary: Negative for dysuria. Musculoskeletal:  Negative for back pain. Skin: Negative for rash. Laceration scalp Neurological: Negative for headaches, focal weakness or numbness. Allergic/Immunilogical: See medication list ___________________________________________   PHYSICAL EXAM:  VITAL SIGNS: ED Triage Vitals  Enc Vitals Group     BP 07/17/16 0716 (!) 137/102     Pulse Rate 07/17/16 0716 85     Resp 07/17/16 0716 18     Temp --      Temp Source 07/17/16 0716 Oral     SpO2 07/17/16 0716 97 %     Weight 07/17/16 0719 157 lb (71.2 kg)     Height 07/17/16 0719 5\' 7"  (1.702 m)     Head Circumference --      Peak Flow --      Pain Score 07/17/16 0723 6     Pain Loc --      Pain Edu? --      Excl. in GC? --     Constitutional: Alert and oriented. Well appearing and in no acute distress. Eyes: Conjunctivae are normal. PERRL. EOMI. Head: Atraumatic. Nose: No congestion/rhinnorhea. Mouth/Throat: Mucous membranes are moist.  Oropharynx non-erythematous. Neck: No stridor.  No cervical spine tenderness to palpation. Hematological/Lymphatic/Immunilogical: No cervical lymphadenopathy. Cardiovascular: Normal rate, regular rhythm. Grossly normal heart sounds.  Good peripheral circulation. Respiratory: Normal respiratory effort.  No retractions. Lungs CTAB. Gastrointestinal: Soft and nontender. No distention. No abdominal bruits. No CVA tenderness. Musculoskeletal: No lower extremity tenderness nor edema.  No joint effusions. Neurologic:  Normal speech and  language. No gross focal neurologic deficits are appreciated. No gait instability. Skin:  Skin is warm, dry and intact. No rash noted. Superficial laceration inferior aspect of scalp. No bleeding. Psychiatric: Mood and affect are normal. Speech and behavior are normal.  ____________________________________________   LABS (all labs ordered are listed, but only abnormal results are displayed)  Labs Reviewed - No data to  display ____________________________________________  EKG   ____________________________________________  RADIOLOGY   ____________________________________________   PROCEDURES  Procedure(s) performed: LACERATION REPAIR Performed by: Joni Reiningonald K Bethani Brugger Authorized by: Joni Reiningonald K Damascus Feldpausch Consent: Verbal consent obtained. Risks and benefits: risks, benefits and alternatives were discussed Consent given by: patient Patient identity confirmed: provided demographic data Prepped and Draped in normal sterile fashion Wound explored Laceration Location: Scalp Laceration Length: 2 cm No Foreign Bodies seen or palpated Irrigation method: syringe Amount of cleaning: standard Skin closure: Dermabond Patient tolerance: Patient tolerated the procedure well with no immediate complications.  Procedures  Critical Care performed: No  ____________________________________________   INITIAL IMPRESSION / ASSESSMENT AND PLAN / ED COURSE  Pertinent labs & imaging results that were available during my care of the patient were reviewed by me and considered in my medical decision making (see chart for details).  MinorLaceration versus abrasion. Patient given discharge care instructions. Patient given a work note for today. Patient advised follow-up family doctor as needed.      ____________________________________________   FINAL CLINICAL IMPRESSION(S) / ED DIAGNOSES  Final diagnoses:  Scalp laceration, initial encounter      NEW MEDICATIONS STARTED DURING THIS VISIT:  There are no discharge medications for this patient.    Note:  This document was prepared using Dragon voice recognition software and may include unintentional dictation errors.    Joni Reiningonald K Josie Burleigh, PA-C 07/17/16 [id number]    Merrily BrittleNeil Rifenbark, MD 07/17/16 [id number]

## 2016-07-20 ENCOUNTER — Ambulatory Visit (INDEPENDENT_AMBULATORY_CARE_PROVIDER_SITE_OTHER): Payer: Commercial Managed Care - PPO | Admitting: Family Medicine

## 2016-07-20 ENCOUNTER — Encounter: Payer: Self-pay | Admitting: Family Medicine

## 2016-07-20 DIAGNOSIS — J0100 Acute maxillary sinusitis, unspecified: Secondary | ICD-10-CM | POA: Diagnosis not present

## 2016-07-20 DIAGNOSIS — R030 Elevated blood-pressure reading, without diagnosis of hypertension: Secondary | ICD-10-CM

## 2016-07-20 DIAGNOSIS — J329 Chronic sinusitis, unspecified: Secondary | ICD-10-CM | POA: Insufficient documentation

## 2016-07-20 MED ORDER — DOXYCYCLINE HYCLATE 100 MG PO TABS
100.0000 mg | ORAL_TABLET | Freq: Two times a day (BID) | ORAL | 0 refills | Status: DC
Start: 2016-07-20 — End: 2016-08-08

## 2016-07-20 NOTE — Telephone Encounter (Signed)
Fine by me 

## 2016-07-20 NOTE — Telephone Encounter (Signed)
Patient is wanting to switch PCP to Dr. Earlene PlaterWallace.

## 2016-07-20 NOTE — Patient Instructions (Addendum)
Nice to meet you. We will treat with doxycycline for a sinus infection. You should continue Flonase and intake Claritin or Zyrtec with this as well. If you develop increased swelling, redness, fevers, eye pain, vision changes, or any new or changing symptoms please seek medical attention immediately. I would advise you get set up with a PCP for management of your blood pressure.

## 2016-07-20 NOTE — Assessment & Plan Note (Signed)
Blood pressure elevated today and has been elevated in the past. He reports white coat hypertension. Advised that he set up an appointment with his new PCP at our [place] location to discuss if treatment of this issue is needed.

## 2016-07-20 NOTE — Progress Notes (Signed)
  Ray AlarEric Ryka Beighley, MD Phone: [phone]  Ray HalterJeremy Rosales is a 41 y.o. male who presents today for same-day visit.  Patient reports several months of intermittent nasal and sinus congestion with worsening recently. Notes left maxillary and frontal sinuses are severely congested. He is blowing clear mucus out of his nose. He notes discomfort in the sinuses around his eye though no eye pain. He has no pain with extraocular motion. He does note a burning itching sensation with his left eye. He also notes sneezing and rhinorrhea. Notes some puffiness in the skin over his sinuses and around his eye though no erythema. He also notes some left ear discomfort. No foreign body sensation in his eye. No photophobia. He notes the sinus pressure and congestion gets worse when he lays down.  PMH: smoker   ROS see history of present illness  Objective  Physical Exam Vitals:   07/20/16 1028 07/20/16 1058  BP: (!) 150/100 (!) 148/98  Pulse: 81   Temp: 98 F (36.7 C)     BP Readings from Last 3 Encounters:  07/20/16 (!) 148/98  07/17/16 (!) 137/102  06/15/16 (!) 184/120   Wt Readings from Last 3 Encounters:  07/20/16 157 lb 12.8 oz (71.6 kg)  07/17/16 157 lb (71.2 kg)  06/15/16 161 lb 9.6 oz (73.3 kg)    Physical Exam  Constitutional: No distress.  HENT:  Head: Normocephalic and atraumatic.  Mild posterior oropharyngeal erythema, no exudate, left TM obscured by cerumen, right TM normal  Eyes: Conjunctivae are normal. Pupils are equal, round, and reactive to light. Right eye exhibits no discharge. Left eye exhibits no discharge.  Extraocular movements intact with no discomfort, mild puffiness to the skin of the upper cheek and lower left forehead, these areas are not tender, there is no induration or fluctuance or erythema, no gross corneal defects noted, no conjunctival erythema  Neck: Neck supple.  Cardiovascular: Normal rate, regular rhythm and normal heart sounds.   Pulmonary/Chest: Effort  normal and breath sounds normal.  Lymphadenopathy:    He has no cervical adenopathy.  Neurological: He is alert. Gait normal.  Skin: He is not diaphoretic.     Assessment/Plan: Please see individual problem list.  Sinusitis Patient with several months of symptoms and now with severe sinus congestion and pressure. He has no eye pain. His discomfort is in his sinuses and left ear. Offered to have his left ear irrigated though he declined. He has no red flag symptoms with his eye. There is no signs of orbital or preseptal cellulitis. I feel that his symptoms are related to sinusitis. We will treat with doxycycline. Can continue Flonase and Claritin or Zyrtec. Vision was checked and it was normal. He is given return precautions.  Elevated blood pressure reading Blood pressure elevated today and has been elevated in the past. He reports white coat hypertension. Advised that he set up an appointment with his new PCP at our [place] location to discuss if treatment of this issue is needed.   No orders of the defined types were placed in this encounter.   Meds ordered this encounter  Medications  . doxycycline (VIBRA-TABS) 100 MG tablet    Sig: Take 1 tablet (100 mg total) by mouth 2 (two) times daily.    Dispense:  14 tablet    Refill:  0    Ray AlarEric Malaiyah Achorn, MD [facility]

## 2016-07-20 NOTE — Assessment & Plan Note (Addendum)
Patient with several months of symptoms and now with severe sinus congestion and pressure. He has no eye pain. His discomfort is in his sinuses and left ear. Offered to have his left ear irrigated though he declined. He has no red flag symptoms with his eye. There is no signs of orbital or preseptal cellulitis. I feel that his symptoms are related to sinusitis. We will treat with doxycycline. Can continue Flonase and Claritin or Zyrtec. Vision was checked and it was normal. He is given return precautions.

## 2016-07-20 NOTE — Progress Notes (Signed)
Pre visit review using our clinic review tool, if applicable. No additional management support is needed unless otherwise documented below in the visit note. 

## 2016-08-01 ENCOUNTER — Emergency Department
Admission: EM | Admit: 2016-08-01 | Discharge: 2016-08-01 | Discharge status: Against Medical Advice | Payer: Commercial Managed Care - PPO

## 2016-08-03 ENCOUNTER — Telehealth: Payer: Self-pay | Admitting: Emergency Medicine

## 2016-08-03 NOTE — Telephone Encounter (Signed)
Called patient due to lwot to inquire about condition and follow up plans. Left message.   

## 2016-08-08 ENCOUNTER — Ambulatory Visit (INDEPENDENT_AMBULATORY_CARE_PROVIDER_SITE_OTHER): Payer: Commercial Managed Care - PPO | Admitting: Family Medicine

## 2016-08-08 ENCOUNTER — Encounter: Payer: Self-pay | Admitting: Family Medicine

## 2016-08-08 VITALS — BP 140/92 | HR 90 | Temp 98.2°F | Ht 67.0 in | Wt 159.0 lb

## 2016-08-08 DIAGNOSIS — F17200 Nicotine dependence, unspecified, uncomplicated: Secondary | ICD-10-CM

## 2016-08-08 DIAGNOSIS — F902 Attention-deficit hyperactivity disorder, combined type: Secondary | ICD-10-CM

## 2016-08-08 DIAGNOSIS — F339 Major depressive disorder, recurrent, unspecified: Secondary | ICD-10-CM | POA: Diagnosis not present

## 2016-08-08 DIAGNOSIS — R030 Elevated blood-pressure reading, without diagnosis of hypertension: Secondary | ICD-10-CM | POA: Diagnosis not present

## 2016-08-08 DIAGNOSIS — G43009 Migraine without aura, not intractable, without status migrainosus: Secondary | ICD-10-CM

## 2016-08-08 MED ORDER — IBUPROFEN 800 MG PO TABS: 800.0000 mg | ORAL_TABLET | Freq: Three times a day (TID) | ORAL | 0 refills | Status: DC | PRN

## 2016-08-08 MED ORDER — AMPHETAMINE-DEXTROAMPHET ER 20 MG PO CP24: 20.0000 mg | ORAL_CAPSULE | ORAL | 0 refills | Status: DC

## 2016-08-08 MED ORDER — AMPHETAMINE-DEXTROAMPHETAMINE 20 MG PO TABS: 20.0000 mg | ORAL_TABLET | Freq: Two times a day (BID) | ORAL | 0 refills | Status: DC

## 2016-08-08 NOTE — Progress Notes (Signed)
Pre visit review using our clinic review tool, if applicable. No additional management support is needed unless otherwise documented below in the visit note. 

## 2016-08-08 NOTE — Progress Notes (Signed)
Ray Rosales is a 41 y.o. male is here to discuss:  History of Present Illness:   Chief Complaint  Patient presents with  . Depression  . ADHD   HPI:  1. Attention deficit hyperactivity disorder (ADHD), combined type. Diagnosed as a child. Was offered medication, but parents declined. Would like to try at this time.     2. Migraine without aura and without status migrainosus, not intractable. Unilateral, pounding, associated with nausea and photophobia. Happens every few weeks. Made better with Motrin. Hx of head trauma. States that he had headaches prior to this.   3. Depression, recurrent (HCC). Severe at times. Seems to have mood swings. No Hx of hallucinations but does endorse episodes of trouble sleeping, spending lots of money, drug use, anger issues. Now, he is having trouble with very low moods and profound fatigue. He can sleep all day long. No SI/HI.     4. Smoker. Not ready to quit.   5. Elevated BP without diagnosis of hypertension. Much improved today prior to his firt visit. States that he knows that I won't "push" him. He is not so nervous.    Health Maintenance Due  Topic Date Due  . HIV Screening  04[birth year]24[birth year]1993   PMHx, SurgHx, SocialHx, FamHx, Medications, and Allergies were reviewed in the Visit Navigator and updated as appropriate.   Patient Active Problem List   Diagnosis Date Noted  . Attention deficit hyperactivity disorder (ADHD), combined type 08/12/2016  . Migraine without aura and without status migrainosus, not intractable 08/12/2016  . Depression, recurrent (HCC) 08/12/2016  . Smoker 08/12/2016  . Seasonal allergies    Social History  Substance Use Topics  . Smoking status: Current Every Day Smoker    Packs/day: 0.50    Types: Cigarettes  . Smokeless tobacco: Never Used  . Alcohol use 0.0 - 0.6 oz/week   Current Medications and Allergies:   .  None  Allergies  Allergen Reactions  . Amoxicillin Hives  . Toradol [Ketorolac Tromethamine]     . Ultram [Tramadol Hcl] Hives   Review of Systems   Review of Systems  Constitutional: Positive for malaise/fatigue. Negative for chills and fever.  HENT: Positive for congestion. Negative for ear pain, sinus pain and sore throat.   Eyes: Negative for blurred vision and double vision.  Respiratory: Negative for cough.   Cardiovascular: Negative for chest pain, palpitations and leg swelling.  Gastrointestinal: Positive for diarrhea. Negative for abdominal pain, constipation and vomiting.  Genitourinary: Negative for dysuria.  Musculoskeletal: Positive for joint pain.  Neurological: Positive for headaches. Negative for dizziness.  Psychiatric/Behavioral: Positive for depression. Negative for hallucinations, memory loss, substance abuse and suicidal ideas. The patient is nervous/anxious. The patient does not have insomnia.     Vitals:   Vitals:   08/08/16 1250  BP: (!) 140/92  Pulse: 90  Temp: 98.2 F (36.8 C)  TempSrc: Oral  SpO2: 98%  Weight: 159 lb (72.1 kg)  Height:  (1.702 m)     Body mass index is 24.9 kg/m.  Physical Exam:    Physical Exam  Constitutional: He is oriented to person, place, and time. He appears well-developed and well-nourished. No distress.  HENT:  Head: Normocephalic and atraumatic.  Right Ear: External ear normal.  Left Ear: External ear normal.  Nose: Nose normal.  Mouth/Throat: Oropharynx is clear and moist.  Eyes: Conjunctivae and EOM are normal. Pupils are equal, round, and reactive to light.  Neck: Normal range of motion. Neck supple.  Cardiovascular: Normal rate, regular rhythm, normal heart sounds and intact distal pulses.   Pulmonary/Chest: Effort normal and breath sounds normal.  Abdominal: Soft. Bowel sounds are normal.  Musculoskeletal: Normal range of motion.  Neurological: He is alert and oriented to person, place, and time.  Skin: Skin is warm.  Healed self-inflicted cuts on bilateral upper arms.  Psychiatric: Judgment and  thought content normal. His mood appears anxious. His affect is not inappropriate. His speech is not rapid and/or pressured, not tangential and not slurred. He is hyperactive. He is not aggressive, not slowed, not withdrawn, not actively hallucinating and not combative. Thought content is not paranoid and not delusional. Cognition and memory are normal. He exhibits a depressed mood. He expresses no homicidal and no suicidal ideation. He expresses no suicidal plans and no homicidal plans.  Nursing note and vitals reviewed.    Assessment and Plan:    Zair was seen today for depression and adhd.  Diagnoses and all orders for this visit:  Attention deficit hyperactivity disorder (ADHD), combined type Comments: Reviewed risks versus benefits at length. Side effects and expectations reviewed at length. Will have patient sign controlled substance contract if we continue the medication here. I will be sending him to Psychiatry for evaluation, so his care and Rx may be taken over.  Orders: -     amphetamine-dextroamphetamine (ADDERALL) 20 MG tablet; Take 1 tablet (20 mg total) by mouth 2 (two) times daily. WITH INSTRUCTIONS TO START WITH ONE HALF PO DAILY AND INCREASE AS NEEDED.  Migraine without aura and without status migrainosus, not intractable Comments: Without red flags. Will have patient monitor. Hydration and symptomatic care reviewed. Okay high dose Motrin as patient dose not use often. If HA worsen or change, he will let me know.  Orders: -     ibuprofen (ADVIL,MOTRIN) 800 MG tablet; Take 1 tablet (800 mg total) by mouth every 8 (eight) hours as needed.  Depression, recurrent (HCC) Comments: Patient with possibe bipolar disorder. Will refer to Psychiatry for further evaluation.  Smoker Comments: Patient is not ready to quit at this time. I will be available when he is.  Elevated BP without diagnosis of hypertension Comments: Likely a large component of anxiety. Patient will  monitor at home until the next visit.     . Reviewed expectations re: course of current medical issues. . Discussed self-management of symptoms. . Outlined signs and symptoms indicating need for more acute intervention. . Patient verbalized understanding and all questions were answered. . See orders for this visit as documented in the electronic medical record. . Patient received an After Visit Summary.   CMA served as Neurosurgeon during this visit. History, Physical, and Plan performed by medical provider. Documentation and orders reviewed and attested to. Helane Rima, D.O.  Helane Rima, D.O. [place] [place] 08/12/2016  Follow-up: Return in about 1 month (around 09/07/2016).

## 2016-08-12 ENCOUNTER — Encounter: Payer: Self-pay | Admitting: Family Medicine

## 2016-08-12 DIAGNOSIS — F339 Major depressive disorder, recurrent, unspecified: Secondary | ICD-10-CM

## 2016-08-12 DIAGNOSIS — J302 Other seasonal allergic rhinitis: Secondary | ICD-10-CM | POA: Insufficient documentation

## 2016-08-12 DIAGNOSIS — G43009 Migraine without aura, not intractable, without status migrainosus: Secondary | ICD-10-CM | POA: Insufficient documentation

## 2016-08-12 DIAGNOSIS — F17200 Nicotine dependence, unspecified, uncomplicated: Secondary | ICD-10-CM | POA: Insufficient documentation

## 2016-08-12 DIAGNOSIS — F902 Attention-deficit hyperactivity disorder, combined type: Secondary | ICD-10-CM

## 2016-08-12 HISTORY — DX: Attention-deficit hyperactivity disorder, combined type: F90.2

## 2016-08-12 HISTORY — DX: Major depressive disorder, recurrent, unspecified: F33.9

## 2016-08-12 HISTORY — DX: Migraine without aura, not intractable, without status migrainosus: G43.009

## 2016-08-12 HISTORY — DX: Nicotine dependence, unspecified, uncomplicated: F17.200

## 2016-08-14 ENCOUNTER — Encounter: Payer: Self-pay | Admitting: Family Medicine

## 2016-08-16 ENCOUNTER — Telehealth: Payer: Self-pay | Admitting: Family Medicine

## 2016-08-16 ENCOUNTER — Other Ambulatory Visit: Payer: Self-pay

## 2016-08-16 NOTE — Telephone Encounter (Signed)
Spoke with fiance who is very concerned about pt. She reports that pt has not been taking his ADHD medication the way he is suppose too. He admitting to snorting multiple tablets at a time and is completely out of 60 tablets. She states that pt has a long history of depression and has had binge behaviors recently of drug use. He is refusing any type of inpatient care. Today, she said he has been at a really low point by physically writing "deep, dark things in sentences." I advised Juliette Alcide to try to encourage pt that inpatient care  would be the best option for him. She is also aware of support information in a Crisis Situation such as 911, Hospitals, Suicide Prevention Lifeline. She is agreeable   Spoke with pt directly over the phone. He stated that the ADHD medication made his depression worse and caused anxiety. Curent referral to Psych will be too expensive and he is aware of that. 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 I also expressed that at this point me and Marijean Niemann would brainstorm places that he could afford and that he should possibly consider an inpatient facility--people he can relate too, state funded, he wont be alone, ect. Pt agreed to not being suicidal but very depressed. He was also agreeable to call 911 or go to ER if he had an new onset of suicide. I reassured him that Marijean Niemann or myself will call in the morning to touch base.

## 2016-08-16 NOTE — Telephone Encounter (Signed)
Is there any other medication that can be prescribed? Ray Rosales is working on the referral.

## 2016-08-16 NOTE — Telephone Encounter (Signed)
Patient states medication he was prescribed is not helping with the depression, and wanted to know if anything had moved forward with him getting a therapist. Confirmed best call back number is one listed.

## 2016-08-16 NOTE — Telephone Encounter (Signed)
Patient's fiance Juliette Alcide called to advise that patient is not taking medication as prescribed and needs help. Juliette Alcide stated is aware that patient may call and tell a different story.  Juliette Alcide stated that she does not want patient to know that she has called to tell the "truth" about what patient is really doing concerning the mediation.   Please call patient's fiance Juliette Alcide [phone] before calling patient with any information. Juliette Alcide also stated she would like to speak to Dr. Earlene Plater directly if possible.   All front office staff was made aware of keeping fiance's call anonymous to patient.

## 2016-08-17 ENCOUNTER — Telehealth: Payer: Self-pay | Admitting: Family Medicine

## 2016-08-17 NOTE — Telephone Encounter (Signed)
LM for patient to return call.

## 2016-08-17 NOTE — Telephone Encounter (Signed)
Spoke with patients girlfriend and they have filled out the information for the [facility] financial assistance. They are going to see if they can get him in with Dr. Maryruth Bun sooner.

## 2016-08-17 NOTE — Telephone Encounter (Signed)
Spoke with patient and gave several resources for substance abuse or counseling. I have gave him the [facility] financial assistance program to also see if he can get help with the cost to see Dr. Maryruth Bun. I advised that the [facility] may be the best place to start because my understanding is that they do a sliding scale with financial assistance.  He started talking about he fills like his girlfriend is trying to rush him to get help. He stated that he thinks that she is trying to get rid of him and do the long term facility. I explained that she is probably just trying to get the help that he needs so that he does not harm himself. I told him that he has to be open and honest with whomever he speaks with about the counseling so they can give him the best help that he needs. I advised that if he gets to the point where he fills like he wants to harm himself that he needs to go to the ED or call 911. He is going to contact the places that I have gave him and then call me back to let me know ho the progress is going.  When talking to the patient about the ADHD medication he stated that he dumped the rest of the medication down the toilet because it was not helping him. He stated that it gave him the energy, but did not help with his depression.

## 2016-08-17 NOTE — Telephone Encounter (Signed)
Patient returned phone call, asked for return call.

## 2016-08-18 NOTE — Telephone Encounter (Signed)
I was available for questions during CMA to patient phone calls. No concerns for safety. Crisis numbers provided. Note: No more controlled substances for this patient from me due to high risk. Will work on expediting psychiatry intervention.

## 2016-08-21 ENCOUNTER — Telehealth: Payer: Self-pay | Admitting: Surgical

## 2016-08-21 NOTE — Telephone Encounter (Signed)
Patient called back and stated that he was going to go with the [facility] that I provided the number for this morning. They told him that they will provide transportation to and from counseling sessions since he does not have transportation. He will call us if he needs anything.

## 2016-08-21 NOTE — Telephone Encounter (Signed)
Patient called back regarding earlier conversation.

## 2016-08-21 NOTE — Telephone Encounter (Signed)
Patient called in to let us know that he was seen at the [facility] Thursday or Friday. He was started on Prozac. He stated that this is making him feel fatigue. He stated that they want him to do counseling 3 days a week, but he has no transportation. I gave him a few numbers for [facility] counseling. I gave him the number for [facility] and [facility] mental health. He stated that his girlfriend thinks that he should be in an inpatient facility. I told him to speak with the facilities to see what they suggest. He is going to call back when he talks to the different places.

## 2016-08-25 ENCOUNTER — Telehealth: Payer: Self-pay | Admitting: Family Medicine

## 2016-08-25 NOTE — Telephone Encounter (Signed)
Patient called stating he is on 2 prescriptions for depression which are giving him bad side effects. Patient stated he was having joint pain, headaches (daily), and blurred vision.   Please call and advise.

## 2016-08-25 NOTE — Telephone Encounter (Signed)
Left message for patient to call back  

## 2016-08-25 NOTE — Telephone Encounter (Signed)
I have spoke with patient and confirmed that he is taking Prozac 1qd  and trazodone 1/2 qhs. He started trazodone x3 days ago and Prozac x1 week ago. For the past 24 hours he has expreienced Headaches, blurry vision, migraines, fatigue and joint pain. He is currently being treated by [facility] and states that he does not want to go back to them. I called the office, spoke with an assistant there who was agreeable to reach out to patient and further discuss medication adjustment. I have reviewed this with Jarold Motto and she is agreeable. Forwarding to Dr. Earlene Plater to review.

## 2016-09-05 ENCOUNTER — Telehealth: Payer: Self-pay | Admitting: Family Medicine

## 2016-09-05 ENCOUNTER — Ambulatory Visit: Payer: Commercial Managed Care - PPO | Admitting: Family Medicine

## 2016-09-05 NOTE — Telephone Encounter (Signed)
Patient called and cancelled same day appointment, stated transmission went out in vehicle.

## 2016-09-19 ENCOUNTER — Emergency Department: Payer: Self-pay

## 2016-09-19 ENCOUNTER — Emergency Department
Admission: EM | Admit: 2016-09-19 | Discharge: 2016-09-20 | Disposition: A | Discharge status: Home / Self Care | Payer: Self-pay | Attending: Emergency Medicine | Admitting: Emergency Medicine

## 2016-09-19 ENCOUNTER — Encounter: Payer: Self-pay | Admitting: Emergency Medicine

## 2016-09-19 DIAGNOSIS — F418 Other specified anxiety disorders: Secondary | ICD-10-CM | POA: Insufficient documentation

## 2016-09-19 DIAGNOSIS — L739 Follicular disorder, unspecified: Secondary | ICD-10-CM

## 2016-09-19 DIAGNOSIS — R45851 Suicidal ideations: Secondary | ICD-10-CM

## 2016-09-19 DIAGNOSIS — N433 Hydrocele, unspecified: Secondary | ICD-10-CM

## 2016-09-19 DIAGNOSIS — N50812 Left testicular pain: Secondary | ICD-10-CM | POA: Insufficient documentation

## 2016-09-19 DIAGNOSIS — F17210 Nicotine dependence, cigarettes, uncomplicated: Secondary | ICD-10-CM | POA: Insufficient documentation

## 2016-09-19 DIAGNOSIS — Z79899 Other long term (current) drug therapy: Secondary | ICD-10-CM | POA: Insufficient documentation

## 2016-09-19 DIAGNOSIS — F902 Attention-deficit hyperactivity disorder, combined type: Secondary | ICD-10-CM | POA: Insufficient documentation

## 2016-09-19 DIAGNOSIS — N50819 Testicular pain, unspecified: Secondary | ICD-10-CM

## 2016-09-19 LAB — COMPREHENSIVE METABOLIC PANEL
ALBUMIN: 4.9 g/dL (ref 3.5–5.0)
ALK PHOS: 67 U/L (ref 38–126)
ALT: 17 U/L (ref 17–63)
ANION GAP: 11 (ref 5–15)
AST: 26 U/L (ref 15–41)
BUN: 21 mg/dL — ABNORMAL HIGH (ref 6–20)
CALCIUM: 9.5 mg/dL (ref 8.9–10.3)
CO2: 21 mmol/L — AB (ref 22–32)
Chloride: 104 mmol/L (ref 101–111)
Creatinine, Ser: 1.18 mg/dL (ref 0.61–1.24)
GFR calc non Af Amer: >60 mL/min (ref >60)
GLUCOSE: 114 mg/dL — AB (ref 65–99)
POTASSIUM: 3.5 mmol/L (ref 3.5–5.1)
SODIUM: 136 mmol/L (ref 135–145)
TOTAL PROTEIN: 7.8 g/dL (ref 6.5–8.1)
Total Bilirubin: 1.4 mg/dL — ABNORMAL HIGH (ref 0.3–1.2)

## 2016-09-19 LAB — CBC
HEMATOCRIT: 48.7 % (ref 40.0–52.0)
Hemoglobin: 16.8 g/dL (ref 13.0–18.0)
MCH: 32.7 pg (ref 26.0–34.0)
MCHC: 34.5 g/dL (ref 32.0–36.0)
MCV: 94.7 fL (ref 80.0–100.0)
Platelets: 269 10*3/uL (ref 150–440)
RBC: 5.14 MIL/uL (ref 4.40–5.90)
RDW: 14.2 % (ref 11.5–14.5)
WBC: 8.2 10*3/uL (ref 3.8–10.6)

## 2016-09-19 LAB — URINE DRUG SCREEN, QUALITATIVE (ARMC ONLY)
AMPHETAMINES, UR SCREEN: NOT DETECTED
BARBITURATES, UR SCREEN: NOT DETECTED
BENZODIAZEPINE, UR SCRN: NOT DETECTED
Cannabinoid 50 Ng, Ur ~~LOC~~: POSITIVE — AB
Cocaine Metabolite,Ur ~~LOC~~: POSITIVE — AB
MDMA (Ecstasy)Ur Screen: NOT DETECTED
METHADONE SCREEN, URINE: NOT DETECTED
Opiate, Ur Screen: NOT DETECTED
Phencyclidine (PCP) Ur S: NOT DETECTED
TRICYCLIC, UR SCREEN: NOT DETECTED

## 2016-09-19 LAB — URINALYSIS, COMPLETE (UACMP) WITH MICROSCOPIC
Bacteria, UA: NONE SEEN
Bilirubin Urine: NEGATIVE
Hgb urine dipstick: NEGATIVE
Ketones, ur: 20 mg/dL — AB
LEUKOCYTES UA: NEGATIVE
Nitrite: NEGATIVE
PH: 5 (ref 5.0–8.0)
PROTEIN: 30 mg/dL — AB
SPECIFIC GRAVITY, URINE: 1.03 (ref 1.005–1.030)
Squamous Epithelial / LPF: NONE SEEN

## 2016-09-19 LAB — SALICYLATE LEVEL

## 2016-09-19 LAB — ACETAMINOPHEN LEVEL

## 2016-09-19 LAB — ETHANOL: Alcohol, Ethyl (B): <5 mg/dL (ref <5)

## 2016-09-19 MED ORDER — LORAZEPAM 1 MG PO TABS
ORAL_TABLET | ORAL | Status: AC
  Administered 2016-09-19: 1 mg via ORAL
  Filled 2016-09-19: qty 1

## 2016-09-19 MED ORDER — SULFAMETHOXAZOLE-TRIMETHOPRIM 800-160 MG PO TABS
1.0000 | ORAL_TABLET | Freq: Two times a day (BID) | ORAL | Status: DC
  Administered 2016-09-19: 1 via ORAL
  Filled 2016-09-19: qty 1

## 2016-09-19 MED ORDER — LORAZEPAM 1 MG PO TABS
1.0000 mg | ORAL_TABLET | Freq: Once | ORAL | Status: AC
Start: 2016-09-19 — End: 2016-09-19
  Administered 2016-09-19: 1 mg via ORAL

## 2016-09-19 NOTE — ED Provider Notes (Signed)
[facility] Emergency Department Provider Note  ____________________________________________   First MD Initiated Contact with Patient 09/19/16 1906     (approximate)  I have reviewed the triage vital signs and the nursing notes.   HISTORY  Chief Complaint Suicidal   HPI Ray Rosales is a 41 y.o. male with a history of depression who is presenting to the emergency department today with suicidal ideation. He says that he has a plan to hang himself but has not attempted any self-harm including any toxic ingestions. Says that he used to have a history of cutting. Also complaining of a bump to his right groin as well as pain to his left testicle and hematuria this morning. Says that his left testicle has been hurting over the past month especially with walking.   Past Medical History:  Diagnosis Date  . Attention deficit hyperactivity disorder (ADHD), combined type 08/12/2016  . Depression, recurrent (HCC) 08/12/2016  . Head injury   . Kidney stones   . Migraine without aura and without status migrainosus, not intractable 08/12/2016  . Seasonal allergies   . Smoker 08/12/2016    Patient Active Problem List   Diagnosis Date Noted  . Attention deficit hyperactivity disorder (ADHD), combined type 08/12/2016  . Migraine without aura and without status migrainosus, not intractable 08/12/2016  . Depression, recurrent (HCC) 08/12/2016  . Smoker 08/12/2016  . Seasonal allergies     Past Surgical History:  Procedure Laterality Date  . APPENDECTOMY    . KNEE SURGERY Right     Prior to Admission medications   Medication Sig Start Date End Date Taking? Authorizing Provider  amphetamine-dextroamphetamine (ADDERALL) 20 MG tablet Take 1 tablet (20 mg total) by mouth 2 (two) times daily. 08/08/16  Yes Helane RimaWallace, Erica, DO  ibuprofen (ADVIL,MOTRIN) 800 MG tablet Take 1 tablet (800 mg total) by mouth every 8 (eight) hours as needed. 08/08/16  Yes Helane RimaWallace, Erica, DO     Allergies Amoxicillin; Toradol [ketorolac tromethamine]; and Ultram [tramadol hcl]  Family History  Problem Relation Age of Onset  . Arthritis Mother   . Bipolar disorder Mother   . Hypertension Father   . ALS Maternal Grandmother   . Mental illness Maternal Grandfather   . Lung cancer Paternal Grandmother   . Bone cancer Paternal Grandmother   . Alcohol abuse Paternal Grandfather     Social History Social History  Substance Use Topics  . Smoking status: Current Every Day Smoker    Packs/day: 0.50    Types: Cigarettes  . Smokeless tobacco: Never Used  . Alcohol use 0.0 - 0.6 oz/week    Review of Systems  Constitutional: No fever/chills Eyes: No visual changes. ENT: No sore throat. Cardiovascular: Denies chest pain. Respiratory: Denies shortness of breath. Gastrointestinal: No abdominal pain.  No nausea, no vomiting.  No diarrhea.  No constipation. Genitourinary: Negative for dysuria. Musculoskeletal: Negative for back pain. Skin as above Neurological: Negative for headaches, focal weakness or numbness.   ____________________________________________   PHYSICAL EXAM:  VITAL SIGNS: ED Triage Vitals  Enc Vitals Group     BP 09/19/16 1839 (!) 140/106     Pulse Rate 09/19/16 1839 (!) 113     Resp 09/19/16 1839 (!) 22     Temp 09/19/16 1839 98 F (36.7 C)     Temp Source 09/19/16 1839 Oral     SpO2 09/19/16 1839 100 %     Weight 09/19/16 1839 160 lb (72.6 kg)     Height 09/19/16 1839 5'  7" (1.702 m)     Head Circumference --      Peak Flow --      Pain Score 09/19/16 1838 6     Pain Loc --      Pain Edu? --      Excl. in GC? --     Constitutional: Alert and oriented. Well appearing and in no acute distress. Eyes: Conjunctivae are normal.  Head: Atraumatic. Nose: No congestion/rhinnorhea. Mouth/Throat: Mucous membranes are moist.  Neck: No stridor.   Cardiovascular: Normal rate, regular rhythm. Grossly normal heart sounds.   Respiratory: Normal  respiratory effort.  No retractions. Lungs CTAB. Gastrointestinal: Soft and nontender. No distention. No CVA tenderness. Genitourinary:  Grossly normal examination of the genitalia in this circumcised male. No masses or lesions visualized. Left testicle with tenderness to palpation as well as to the epididymis. No horizontal lie. Tenderness is mild to moderate. No testicular masses. Musculoskeletal: No lower extremity tenderness nor edema.  No joint effusions. Neurologic:  Normal speech and language. No gross focal neurologic deficits are appreciated. Skin:  One centimeter, round and raised nodule with a 1 cm surrounding wheal of erythema to the right groin that appears to be an infected hair follicle. Psychiatric: Mood and affect are normal. Speech and behavior are normal.  ____________________________________________   LABS (all labs ordered are listed, but only abnormal results are displayed)  Labs Reviewed  COMPREHENSIVE METABOLIC PANEL - Abnormal; Notable for the following:       Result Value   CO2 21 (*)    Glucose, Bld 114 (*)    BUN 21 (*)    Total Bilirubin 1.4 (*)    All other components within normal limits  ACETAMINOPHEN LEVEL - Abnormal; Notable for the following:    Acetaminophen (Tylenol), Serum <10 (*)    All other components within normal limits  URINE DRUG SCREEN, QUALITATIVE ([facility] ONLY) - Abnormal; Notable for the following:    Cocaine Metabolite,Ur [place] POSITIVE (*)    Cannabinoid 50 Ng, Ur [place] POSITIVE (*)    All other components within normal limits  URINALYSIS, COMPLETE (UACMP) WITH MICROSCOPIC - Abnormal; Notable for the following:    Color, Urine YELLOW (*)    APPearance CLEAR (*)    Glucose, UA >=500 (*)    Ketones, ur 20 (*)    Protein, ur 30 (*)    All other components within normal limits  ETHANOL  SALICYLATE LEVEL  CBC    ____________________________________________  EKG   ____________________________________________  RADIOLOGY   ____________________________________________   PROCEDURES  Procedure(s) performed:   Procedures  Critical Care performed:   ____________________________________________   INITIAL IMPRESSION / ASSESSMENT AND PLAN / ED COURSE  Pertinent labs & imaging results that were available during my care of the patient were reviewed by me and considered in my medical decision making (see chart for details).  Patient placed under involuntary commitment. Placed on Bactrim for his folliculitis.      ____________________________________________   FINAL CLINICAL IMPRESSION(S) / ED DIAGNOSES  Final diagnoses:  Testicular pain  Folliculitis. Suicidal ideation.    NEW MEDICATIONS STARTED DURING THIS VISIT:  New Prescriptions   No medications on file     Note:  This document was prepared using Dragon voice recognition software and may include unintentional dictation errors.     Myrna Blazer, MD 09/19/16 2037

## 2016-09-19 NOTE — ED Notes (Signed)
SOC at bedside. 
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
## 2016-09-19 NOTE — ED Triage Notes (Addendum)
Pt c/o SI with plan to hang himself. Reports feels like going to have a panic attack.  Denies HI/hallucinations.  Appears restless in triage.  Has self mutilated since teenager per pt.  Also c/o pain to left testicle on and off for last month. Reports blood came out when tried to urinate

## 2016-09-20 MED ORDER — SULFAMETHOXAZOLE-TRIMETHOPRIM 800-160 MG PO TABS: 1.0000 | ORAL_TABLET | Freq: Two times a day (BID) | ORAL | 0 refills | Status: DC

## 2016-09-20 NOTE — ED Notes (Addendum)
PT called for ride. PT family took belongings home per pt. Pt to wear blue scrubs home.   [facility]

## 2016-09-20 NOTE — ED Provider Notes (Signed)
-----------------------------------------   1:13 AM on 09/20/2016 -----------------------------------------   Blood pressure (!) 115/93, pulse 93, temperature 97.8 F (36.6 C), temperature source Oral, resp. rate 20, height 5\' 7"  (1.702 m), weight 160 lb (72.6 kg), SpO2 100 %.  Assuming care from Dr. Pershing ProudSchaevitz.  In short, Ray Rosales is a 41 y.o. male with a chief complaint of Suicidal .  Refer to the original H&P for additional details.  The current plan of care is to follow up the recommendation of the specialist on call.  The patient was seen by the specialist on-call for psychiatry and they felt that the patient did not meet inpatient criteria. They felt that the patient is not a danger to himself and can follow-up with outpatient psychiatry. The patient will be discharged home. The patient does have a large hydrocele and does need to follow up with urology.        Rebecka ApleyWebster, Allison P, MD 09/20/16 [phone]

## 2016-09-20 NOTE — ED Notes (Addendum)
Pt. Verbalizes understanding of d/c instructions, prescriptions, and follow-up. VS stable and pain controlled per pt.  Pt. In NAD at time of d/c and denies further concerns regarding this visit. Pt. Stable at the time of departure from the unit, departing unit by the safest and most appropriate manner per that pt condition and limitations. Pt departing with fiance in blue scrubs with shoes fiance brought. Pt advised to return to the ED at any time for emergent concerns, or for new/worsening symptoms.

## 2016-09-21 ENCOUNTER — Telehealth: Payer: Self-pay | Admitting: Surgical

## 2016-09-21 NOTE — Telephone Encounter (Signed)
Left message for patient to return call. Patient was in the ED yesterday.

## 2016-09-22 ENCOUNTER — Telehealth: Payer: Self-pay | Admitting: *Deleted

## 2016-09-22 ENCOUNTER — Ambulatory Visit (INDEPENDENT_AMBULATORY_CARE_PROVIDER_SITE_OTHER): Payer: Self-pay | Admitting: Urology

## 2016-09-22 ENCOUNTER — Encounter: Payer: Self-pay | Admitting: Urology

## 2016-09-22 VITALS — BP 154/85 | HR 77 | Ht 67.0 in | Wt 154.0 lb

## 2016-09-22 DIAGNOSIS — R310 Gross hematuria: Secondary | ICD-10-CM

## 2016-09-22 DIAGNOSIS — N453 Epididymo-orchitis: Secondary | ICD-10-CM

## 2016-09-22 DIAGNOSIS — N433 Hydrocele, unspecified: Secondary | ICD-10-CM

## 2016-09-22 MED ORDER — SULFAMETHOXAZOLE-TRIMETHOPRIM 800-160 MG PO TABS: 1.0000 | ORAL_TABLET | Freq: Two times a day (BID) | ORAL | 0 refills | Status: DC

## 2016-09-22 MED ORDER — IBUPROFEN 600 MG PO TABS: 600.0000 mg | ORAL_TABLET | Freq: Three times a day (TID) | ORAL | 0 refills | Status: DC

## 2016-09-22 MED ORDER — TAMSULOSIN HCL 0.4 MG PO CAPS: 0.4000 mg | ORAL_CAPSULE | Freq: Every day | ORAL | 5 refills | Status: DC

## 2016-09-22 MED ORDER — DOXYCYCLINE HYCLATE 100 MG PO CAPS: 100.0000 mg | ORAL_CAPSULE | Freq: Two times a day (BID) | ORAL | 0 refills | Status: DC

## 2016-09-22 NOTE — Patient Instructions (Addendum)
Hydrocele, Adult A hydrocele is a collection of fluid in the loose pouch of skin that holds the testicles (scrotum). Usually, it affects only one testicle. What are the causes? This condition may be caused by:  An injury to the scrotum.  An infection.  A tumor or cancer of the testicle.  Twisting of a testicle.  Decreased blood flow to the scrotum. What are the signs or symptoms? A hydrocele feels like a water-filled balloon. It may also feel heavy. A hydrocele can cause:  Swelling of the scrotum. The swelling may decrease when you lie down.  Swelling of the groin.  Mild discomfort in the scrotum.  Pain. This can develop if the hydrocele was caused by infection or twisting. How is this diagnosed? This condition may be diagnosed with a medical history, physical exam, and imaging tests. You may also have blood and urine tests to check for infection. How is this treated? Treatment may include:  Watching and waiting, particularly if the hydrocele causes no symptoms.  Treatment of the underlying condition. This may include using antibiotic medicine.  Surgery to drain the fluid. Some surgical options include:  Needle aspiration. For this procedure, a needle is used to drain fluid.  Hydrocelectomy. For this procedure, an incision is made in the scrotum to remove the fluid sac. Follow these instructions at home:  Keep all follow-up visits as told by your health care provider. This is important.  Watch the hydrocele for any changes.  Take over-the-counter and prescription medicines only as told by your health care provider.  If you were prescribed an antibiotic medicine, use it as told by your health care provider. Do not stop using the antibiotic even if your condition improves. Contact a health care provider if:  The swelling in your scrotum or groin gets worse.  The hydrocele becomes red, firm, tender to the touch, or painful.  You notice any changes in the  hydrocele.  You have a fever. This information is not intended to replace advice given to you by your health care provider. Make sure you discuss any questions you have with your health care provider. Document Released: 10/12/2009 Document Revised: 09/30/2015 Document Reviewed: 04/20/2014 Elsevier Interactive Patient Education  2017 [employer] Inc[employer]  Cystoscopy Cystoscopy is a procedure that is used to help diagnose and sometimes treat conditions that affect that lower urinary tract. The lower urinary tract includes the bladder and the tube that drains urine from the bladder out of the body (urethra). Cystoscopy is performed with a thin, tube-shaped instrument with a light and camera at the end (cystoscope). The cystoscope may be hard (rigid) or flexible, depending on the goal of the procedure.The cystoscope is inserted through the urethra, into the bladder. Cystoscopy may be recommended if you have:  Urinary tractinfections that keep coming back (recurring).  Blood in the urine (hematuria).  Loss of bladder control (urinary incontinence) or an overactive bladder.  Unusual cells found in a urine sample.  A blockage in the urethra.  Painful urination.  An abnormality in the bladder found during an intravenous pyelogram (IVP) or CT scan. Cystoscopy may also be done to remove a sample of tissue to be examined under a microscope (biopsy). Tell a health care provider about:  Any allergies you have.  All medicines you are taking, including vitamins, herbs, eye drops, creams, and over-the-counter medicines.  Any problems you or family members have had with anesthetic medicines.  Any blood disorders you have.  Any surgeries you have had.  Any  medical conditions you have.  Whether you are pregnant or may be pregnant. What are the risks? Generally, this is a safe procedure. However, problems may occur, including:  Infection.  Bleeding.  Allergic reactions to medicines.  Damage  to other structures or organs. What happens before the procedure?  Ask your health care provider about:  Changing or stopping your regular medicines. This is especially important if you are taking diabetes medicines or blood thinners.  Taking medicines such as aspirin and ibuprofen. These medicines can thin your blood. Do not take these medicines before your procedure if your health care provider instructs you not to.  Follow instructions from your health care provider about eating or drinking restrictions.  You may be given antibiotic medicine to help prevent infection.  You may have an exam or testing, such as X-rays of the bladder, urethra, or kidneys.  You may have urine tests to check for signs of infection.  Plan to have someone take you home after the procedure. What happens during the procedure?  To reduce your risk of infection,your health care team will wash or sanitize their hands.  You will be given one or more of the following:  A medicine to help you relax (sedative).  A medicine to numb the area (local anesthetic).  The area around the opening of your urethra will be cleaned.  The cystoscope will be passed through your urethra into your bladder.  Germ-free (sterile)fluid will flow through the cystoscope to fill your bladder. The fluid will stretch your bladder so that your surgeon can clearly examine your bladder walls.  The cystoscope will be removed and your bladder will be emptied. The procedure may vary among health care providers and hospitals. What happens after the procedure?  You may have some soreness or pain in your abdomen and urethra. Medicines will be available to help you.  You may have some blood in your urine.  Do not drive for 24 hours if you received a sedative. This information is not intended to replace advice given to you by your health care provider. Make sure you discuss any questions you have with your health care provider. Document  Released: 04/21/2000 Document Revised: 09/02/2015 Document Reviewed: 03/11/2015 Elsevier Interactive Patient Education  2017 [employer]
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
## 2016-09-22 NOTE — Telephone Encounter (Signed)
[facility] after todays visit in [place] and stated the doxycycline was to expensive and wanted something cheaper. Called patient back to let him know that Dr. Apolinar JunesBrandon sent him in Bactrim [facility]. Pateint ok with plan.

## 2016-10-13 ENCOUNTER — Emergency Department
Admission: EM | Admit: 2016-10-13 | Discharge: 2016-10-14 | Disposition: A | Discharge status: Home / Self Care | Payer: No Typology Code available for payment source | Attending: Emergency Medicine | Admitting: Emergency Medicine

## 2016-10-13 DIAGNOSIS — R1030 Lower abdominal pain, unspecified: Secondary | ICD-10-CM | POA: Insufficient documentation

## 2016-10-13 DIAGNOSIS — Z791 Long term (current) use of non-steroidal anti-inflammatories (NSAID): Secondary | ICD-10-CM | POA: Insufficient documentation

## 2016-10-13 DIAGNOSIS — F902 Attention-deficit hyperactivity disorder, combined type: Secondary | ICD-10-CM | POA: Insufficient documentation

## 2016-10-13 DIAGNOSIS — R1032 Left lower quadrant pain: Secondary | ICD-10-CM

## 2016-10-13 DIAGNOSIS — F17210 Nicotine dependence, cigarettes, uncomplicated: Secondary | ICD-10-CM | POA: Insufficient documentation

## 2016-10-13 DIAGNOSIS — Z79899 Other long term (current) drug therapy: Secondary | ICD-10-CM | POA: Insufficient documentation

## 2016-10-13 LAB — URINALYSIS, COMPLETE (UACMP) WITH MICROSCOPIC
BACTERIA UA: NONE SEEN
Bilirubin Urine: NEGATIVE
Glucose, UA: NEGATIVE mg/dL
HGB URINE DIPSTICK: NEGATIVE
Ketones, ur: 20 mg/dL — AB
Leukocytes, UA: NEGATIVE
Nitrite: NEGATIVE
PROTEIN: NEGATIVE mg/dL
RBC / HPF: NONE SEEN RBC/hpf (ref 0–5)
SQUAMOUS EPITHELIAL / LPF: NONE SEEN
Specific Gravity, Urine: 1.03 (ref 1.005–1.030)
pH: 6 (ref 5.0–8.0)

## 2016-10-13 LAB — CBC WITH DIFFERENTIAL/PLATELET
Basophils Absolute: 0 10*3/uL (ref 0–0.1)
Basophils Relative: 1 %
EOS PCT: 1 %
Eosinophils Absolute: 0.1 10*3/uL (ref 0–0.7)
HCT: 43.9 % (ref 40.0–52.0)
Hemoglobin: 14.8 g/dL (ref 13.0–18.0)
LYMPHS PCT: 49 %
Lymphs Abs: 2.7 10*3/uL (ref 1.0–3.6)
MCH: 32.4 pg (ref 26.0–34.0)
MCHC: 33.7 g/dL (ref 32.0–36.0)
MCV: 95.9 fL (ref 80.0–100.0)
Monocytes Absolute: 0.5 10*3/uL (ref 0.2–1.0)
Monocytes Relative: 10 %
NEUTROS ABS: 2.1 10*3/uL (ref 1.4–6.5)
Neutrophils Relative %: 39 %
Platelets: 195 10*3/uL (ref 150–440)
RBC: 4.58 MIL/uL (ref 4.40–5.90)
RDW: 14.4 % (ref 11.5–14.5)
WBC: 5.4 10*3/uL (ref 3.8–10.6)

## 2016-10-13 LAB — COMPREHENSIVE METABOLIC PANEL
ALT: 14 U/L — AB (ref 17–63)
AST: 20 U/L (ref 15–41)
Albumin: 4.6 g/dL (ref 3.5–5.0)
Alkaline Phosphatase: 46 U/L (ref 38–126)
Anion gap: 9 (ref 5–15)
BUN: 21 mg/dL — AB (ref 6–20)
CHLORIDE: 103 mmol/L (ref 101–111)
CO2: 23 mmol/L (ref 22–32)
Calcium: 9.2 mg/dL (ref 8.9–10.3)
Creatinine, Ser: 1.09 mg/dL (ref 0.61–1.24)
GFR calc Af Amer: >60 mL/min (ref >60)
Glucose, Bld: 90 mg/dL (ref 65–99)
Potassium: 3.6 mmol/L (ref 3.5–5.1)
Sodium: 135 mmol/L (ref 135–145)
Total Bilirubin: 0.8 mg/dL (ref 0.3–1.2)
Total Protein: 7.2 g/dL (ref 6.5–8.1)

## 2016-10-13 MED ORDER — OXYCODONE HCL 5 MG PO TABS
10.0000 mg | ORAL_TABLET | Freq: Once | ORAL | Status: AC
  Administered 2016-10-13: 10 mg via ORAL
  Filled 2016-10-13: qty 2

## 2016-10-13 MED ORDER — OXYCODONE-ACETAMINOPHEN 5-325 MG PO TABS
1.0000 | ORAL_TABLET | ORAL | Status: DC | PRN
  Administered 2016-10-13: 1 via ORAL
  Filled 2016-10-13: qty 1

## 2016-10-13 MED ORDER — SODIUM CHLORIDE 0.9 % IV BOLUS (SEPSIS)
1000.0000 mL | Freq: Once | INTRAVENOUS | Status: AC
  Administered 2016-10-13: 1000 mL via INTRAVENOUS

## 2016-10-13 MED ORDER — ONDANSETRON HCL 4 MG/2ML IJ SOLN
4.0000 mg | Freq: Once | INTRAMUSCULAR | Status: AC
  Administered 2016-10-13: 4 mg via INTRAVENOUS
  Filled 2016-10-13: qty 2

## 2016-10-13 NOTE — ED Notes (Signed)
Spoke with Dr Lamont Snowballifenbark regarding pt's c/o's and presenting s/x's. VORB for UA, no other labs or imaging to be ordered at this time.

## 2016-10-13 NOTE — ED Notes (Signed)
Explanation of delay provided to pt and spouse who verbalize understanding.

## 2016-10-13 NOTE — ED Notes (Signed)
Pt states groin pain that radiates to bilateral lower abd quadrants. Pt states he has cloudy urine, "maybe" hematuria. Pt states pain is worse with movement. Pt also complains of stabbing pain in penis. Pt states has had an appendectomy. Pt complains of pain to groin with movement. Pt denies fever. Pt states "that medicine they gave me out front didn't help much."
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
## 2016-10-13 NOTE — ED Notes (Signed)
Dr. Lenard Lancepaduchowski in to see pt.

## 2016-10-13 NOTE — ED Notes (Signed)
Plan of care discussed with pt who verbalizes understanding.

## 2016-10-13 NOTE — ED Triage Notes (Signed)
Pt presents to ED with c/o groin, testicle, and prostate pain. Pt reports being seen for same recently, followed up with Urologist Apolinar Junes(Brandon) and has appt. scheduled for June 15th for a scope. Pt reports severe pain with nausea, and difficulty ambulating. Pt reports there "might have been some blood in his urine today, but not entirely sure".

## 2016-10-13 NOTE — ED Notes (Signed)
Pt reports an allergy to Tramadol but states he can take Percocet without any problems.

## 2016-10-14 MED ORDER — MELOXICAM 7.5 MG PO TABS: 7.5000 mg | ORAL_TABLET | Freq: Two times a day (BID) | ORAL | 0 refills | Status: DC

## 2016-10-14 NOTE — ED Notes (Signed)

## 2016-10-14 NOTE — Discharge Instructions (Signed)
Please call Dr. Apolinar JunesBrandon to see if you can give a sooner follow-up appointment. Return to the emergency department for any acute worsening of pain, fever, or any other symptom personally concerning to yourself.

## 2016-10-15 ENCOUNTER — Encounter: Payer: Self-pay | Admitting: *Deleted

## 2016-10-15 ENCOUNTER — Emergency Department
Admission: EM | Admit: 2016-10-15 | Discharge: 2016-10-15 | Discharge status: Against Medical Advice | Payer: No Typology Code available for payment source | Attending: Emergency Medicine | Admitting: Emergency Medicine

## 2016-10-15 DIAGNOSIS — R109 Unspecified abdominal pain: Secondary | ICD-10-CM | POA: Insufficient documentation

## 2016-10-15 NOTE — ED Notes (Signed)
Pt updated about wait 

## 2016-10-15 NOTE — ED Triage Notes (Addendum)
Pt arrives with complaints of bladder pain, states pain increases with urination, states he is schedule for a scope Friday but the pain is too intense, denies any blood in his urine currently, pt appears to be uncomfortable in triage, moaning, holding stomach and tense, rocking in wheelchair, states hx of kidney stones

## 2016-10-15 NOTE — ED Notes (Signed)
Per Dr. Derrill KayGoodman at this time no protocols ordered or pain medicine

## 2016-10-16 ENCOUNTER — Emergency Department
Admission: EM | Admit: 2016-10-16 | Discharge: 2016-10-17 | Disposition: A | Discharge status: Home / Self Care | Payer: Self-pay | Attending: Emergency Medicine | Admitting: Emergency Medicine

## 2016-10-16 ENCOUNTER — Encounter: Payer: Self-pay | Admitting: Emergency Medicine

## 2016-10-16 ENCOUNTER — Telehealth: Payer: Self-pay | Admitting: Urology

## 2016-10-16 DIAGNOSIS — F17210 Nicotine dependence, cigarettes, uncomplicated: Secondary | ICD-10-CM | POA: Insufficient documentation

## 2016-10-16 DIAGNOSIS — Z79899 Other long term (current) drug therapy: Secondary | ICD-10-CM | POA: Insufficient documentation

## 2016-10-16 DIAGNOSIS — N410 Acute prostatitis: Secondary | ICD-10-CM | POA: Insufficient documentation

## 2016-10-16 DIAGNOSIS — Z791 Long term (current) use of non-steroidal anti-inflammatories (NSAID): Secondary | ICD-10-CM | POA: Insufficient documentation

## 2016-10-16 DIAGNOSIS — N419 Inflammatory disease of prostate, unspecified: Secondary | ICD-10-CM

## 2016-10-16 DIAGNOSIS — R1031 Right lower quadrant pain: Secondary | ICD-10-CM | POA: Insufficient documentation

## 2016-10-16 DIAGNOSIS — R310 Gross hematuria: Secondary | ICD-10-CM | POA: Insufficient documentation

## 2016-10-16 DIAGNOSIS — F909 Attention-deficit hyperactivity disorder, unspecified type: Secondary | ICD-10-CM | POA: Insufficient documentation

## 2016-10-16 MED ORDER — MORPHINE SULFATE (PF) 4 MG/ML IV SOLN
INTRAVENOUS | Status: AC
  Administered 2016-10-16: 4 mg
  Filled 2016-10-16: qty 1

## 2016-10-16 MED ORDER — ONDANSETRON HCL 4 MG/2ML IJ SOLN
INTRAMUSCULAR | Status: AC
  Administered 2016-10-16: 4 mg
  Filled 2016-10-16: qty 2

## 2016-10-16 NOTE — ED Triage Notes (Signed)
Pt presents to triage room in distress reports pain to penis urinating Blood reports seen by Urologist scheduled to have a cystoscopy on Friday, pt reports severe pressure, burning sensation while voiding.

## 2016-10-16 NOTE — Telephone Encounter (Signed)
Patient called asking to move their app up to a sooner day because they are in so much pain. They stated that they could not get any pain meds in the ED and that you advised them to go last night and they did but they did not stay, they left without being seen. He actually did not call a lady called for him. Do you want his app moved up or is Friday still ok for him to be seen?  Marcelino DusterMichelle

## 2016-10-16 NOTE — Telephone Encounter (Signed)
The appointment on Friday is for cystoscopy and not meant to address this chronic testicular pain. To be clear, I will not be prescribing narcotic pain medications other than ibuprofen. I'm happy to refer him to pain clinic. If he needs to be sooner, he can be worked into any open slot as possible with any provider ([facility] md?).  Ray ScotlandAshley Sharene Krikorian, MD

## 2016-10-16 NOTE — ED Notes (Addendum)
Pt reports that he is voiding straight blood tonight from his penis and reports that he is having severe pain in his penis - he has been voiding blood for the past month and is being seen by the urologist and has a cystoscope scheduled Friday - pt appears in severe pain and is rocking back and forth/crying/begging for help - Dr Pershing ProudSchaevitz gave verbal order for Zofran 4mg  and Morphine 4mg 

## 2016-10-16 NOTE — Telephone Encounter (Signed)
Just spoke to TolsonaMelinda and she said that they would just keep his app on Friday.  Marcelino DusterMichelle

## 2016-10-17 ENCOUNTER — Emergency Department: Payer: Self-pay

## 2016-10-17 LAB — URINALYSIS, COMPLETE (UACMP) WITH MICROSCOPIC
Bacteria, UA: NONE SEEN
Bilirubin Urine: NEGATIVE
GLUCOSE, UA: 50 mg/dL — AB
Ketones, ur: NEGATIVE mg/dL
Leukocytes, UA: NEGATIVE
Nitrite: NEGATIVE
PH: 5 (ref 5.0–8.0)
Protein, ur: NEGATIVE mg/dL
Specific Gravity, Urine: 1.042 — ABNORMAL HIGH (ref 1.005–1.030)

## 2016-10-17 LAB — CBC
HCT: 47.5 % (ref 40.0–52.0)
Hemoglobin: 16 g/dL (ref 13.0–18.0)
MCH: 32.8 pg (ref 26.0–34.0)
MCHC: 33.7 g/dL (ref 32.0–36.0)
MCV: 97.3 fL (ref 80.0–100.0)
PLATELETS: 238 10*3/uL (ref 150–440)
RBC: 4.88 MIL/uL (ref 4.40–5.90)
RDW: 14.8 % — ABNORMAL HIGH (ref 11.5–14.5)
WBC: 7.9 10*3/uL (ref 3.8–10.6)

## 2016-10-17 LAB — HEPATIC FUNCTION PANEL
ALT: 15 U/L — AB (ref 17–63)
AST: 25 U/L (ref 15–41)
Albumin: 4.6 g/dL (ref 3.5–5.0)
Alkaline Phosphatase: 54 U/L (ref 38–126)
BILIRUBIN TOTAL: 0.4 mg/dL (ref 0.3–1.2)
Total Protein: 7.3 g/dL (ref 6.5–8.1)

## 2016-10-17 LAB — BASIC METABOLIC PANEL
Anion gap: 9 (ref 5–15)
BUN: 21 mg/dL — AB (ref 6–20)
CALCIUM: 9.5 mg/dL (ref 8.9–10.3)
CO2: 26 mmol/L (ref 22–32)
CREATININE: 1.23 mg/dL (ref 0.61–1.24)
Chloride: 105 mmol/L (ref 101–111)
GFR calc Af Amer: >60 mL/min (ref >60)
Glucose, Bld: 98 mg/dL (ref 65–99)
Potassium: 3.8 mmol/L (ref 3.5–5.1)
SODIUM: 140 mmol/L (ref 135–145)

## 2016-10-17 MED ORDER — OXYCODONE-ACETAMINOPHEN 5-325 MG PO TABS
1.0000 | ORAL_TABLET | Freq: Once | ORAL | Status: AC
Start: 2016-10-17 — End: 2016-10-17
  Administered 2016-10-17: 1 via ORAL
  Filled 2016-10-17: qty 1

## 2016-10-17 MED ORDER — HYDROMORPHONE HCL 1 MG/ML IJ SOLN
1.0000 mg | Freq: Once | INTRAMUSCULAR | Status: AC
  Administered 2016-10-17: 1 mg via INTRAVENOUS
  Filled 2016-10-17: qty 1

## 2016-10-17 MED ORDER — POLYETHYLENE GLYCOL 3350 17 G PO PACK: 17.0000 g | PACK | Freq: Every day | ORAL | 0 refills | Status: DC

## 2016-10-17 MED ORDER — IOPAMIDOL (ISOVUE-300) INJECTION 61%
100.0000 mL | Freq: Once | INTRAVENOUS | Status: AC | PRN
  Administered 2016-10-17: 100 mL via INTRAVENOUS

## 2016-10-17 MED ORDER — OXYCODONE-ACETAMINOPHEN 5-325 MG PO TABS: 1.0000 | ORAL_TABLET | Freq: Four times a day (QID) | ORAL | 0 refills | Status: DC | PRN

## 2016-10-17 MED ORDER — MORPHINE SULFATE (PF) 4 MG/ML IV SOLN
4.0000 mg | Freq: Once | INTRAVENOUS | Status: AC
  Administered 2016-10-17: 4 mg via INTRAVENOUS
  Filled 2016-10-17: qty 1

## 2016-10-17 MED ORDER — CIPROFLOXACIN HCL 500 MG PO TABS: 500.0000 mg | ORAL_TABLET | Freq: Two times a day (BID) | ORAL | 0 refills | Status: AC

## 2016-10-17 MED ORDER — ONDANSETRON 4 MG PO TBDP: 4.0000 mg | ORAL_TABLET | Freq: Three times a day (TID) | ORAL | 0 refills | Status: DC | PRN

## 2016-10-17 MED ORDER — SODIUM CHLORIDE 0.9 % IV BOLUS (SEPSIS)
1000.0000 mL | Freq: Once | INTRAVENOUS | Status: AC
  Administered 2016-10-17: 1000 mL via INTRAVENOUS

## 2016-10-17 MED ORDER — CIPROFLOXACIN IN D5W 400 MG/200ML IV SOLN
400.0000 mg | Freq: Once | INTRAVENOUS | Status: AC
  Administered 2016-10-17: 400 mg via INTRAVENOUS
  Filled 2016-10-17: qty 200
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
## 2016-10-17 NOTE — ED Notes (Signed)
Pt discharged to home.  Family member driving.  Discharge instructions reviewed.  Verbalized understanding.  No questions or concerns at this time.  Teach back verified.  Pt in NAD.  No items left in ED.   

## 2016-10-17 NOTE — Discharge Instructions (Signed)
Please follow up with urology

## 2016-10-19 ENCOUNTER — Other Ambulatory Visit: Payer: Self-pay

## 2016-10-19 DIAGNOSIS — R310 Gross hematuria: Secondary | ICD-10-CM

## 2016-10-20 ENCOUNTER — Encounter: Payer: Self-pay | Admitting: Urology

## 2016-10-20 ENCOUNTER — Other Ambulatory Visit
Admission: RE | Admit: 2016-10-20 | Discharge: 2016-10-20 | Disposition: A | Discharge status: Home / Self Care | Payer: No Typology Code available for payment source | Source: Ambulatory Visit | Attending: Urology | Admitting: Urology

## 2016-10-20 ENCOUNTER — Ambulatory Visit (INDEPENDENT_AMBULATORY_CARE_PROVIDER_SITE_OTHER): Payer: Self-pay | Admitting: Urology

## 2016-10-20 VITALS — BP 137/103 | HR 93 | Ht 67.0 in | Wt 152.0 lb

## 2016-10-20 DIAGNOSIS — R310 Gross hematuria: Secondary | ICD-10-CM

## 2016-10-20 LAB — URINALYSIS, COMPLETE (UACMP) WITH MICROSCOPIC
BILIRUBIN URINE: NEGATIVE
Bacteria, UA: NONE SEEN
GLUCOSE, UA: NEGATIVE mg/dL
Hgb urine dipstick: NEGATIVE
Ketones, ur: NEGATIVE mg/dL
NITRITE: NEGATIVE
PH: 8.5 — AB (ref 5.0–8.0)
Protein, ur: NEGATIVE mg/dL
RBC / HPF: NONE SEEN RBC/hpf (ref 0–5)
SPECIFIC GRAVITY, URINE: 1.015 (ref 1.005–1.030)
Squamous Epithelial / LPF: NONE SEEN

## 2016-10-20 LAB — CHLAMYDIA/NGC RT PCR (ARMC ONLY)
Chlamydia Tr: NOT DETECTED
N gonorrhoeae: NOT DETECTED

## 2016-10-20 MED ORDER — CIPROFLOXACIN HCL 500 MG PO TABS: 500.0000 mg | ORAL_TABLET | Freq: Once | ORAL | Status: DC

## 2016-10-20 MED ORDER — LIDOCAINE HCL 2 % EX GEL
1.0000 "application " | Freq: Once | CUTANEOUS | Status: AC
  Administered 2016-10-20: 1 via URETHRAL

## 2016-10-20 NOTE — Progress Notes (Signed)
error 

## 2016-10-20 NOTE — Progress Notes (Signed)
10/20/16  CC:  Chief Complaint  Patient presents with  . Cysto    HPI: 41 year old male with history of anxiety, depression, and substance abuse who was admitted initially for evaluation 09/22/2016 with left testicular discomfort which had been going on for about a month at that time. Since then, he has had progression of his symptoms with development of lower abdominal pain which is constant but exacerbated a 4 and after voiding. He describes the pain as feeling like a razor blades. He also had episodes of gross hematuria although all urinalyses showed no evidence of blood whatsoever including no microscopic rbc's. She does bring in a picture of the toilet which is bloodstained. He reports that when he does have these episodes, he appears frank bright red blood.  In my last appointment, he was treated with NSAIDs, 800 of Motrin 3 times a day 7 days which did not help with the symptoms. He also was given Bactrim which she was unable to tolerate due to a rash.  Due to ongoing and increasing severity of his pain, he was seen in the emergency room several times. Mr. Oneita JollyZulli, he underwent CT abdomen and pelvis with contrast which showed no obvious pathology other than a moderate left-sided hydrocele as well as mildly decreased attenuation on the right side of the prostate but no discrete abscess.  He was started on Cipro on 10/17/2016 and given narcotics.   He presents today for cystoscopy for further evaluation of source of his gross hematuria.  He denies any penile discharge. No fevers or chills. He denies any overt dysuria.  No constipation.  CT scan from 10/17/2016 was personally reviewed today along with all other labs from the emergency room. UA today is unremarkable.  Blood pressure (!) 137/103, pulse 93, height 5\' 7"  (1.702 m), weight 152 lb (68.9 kg). NED. A&Ox3.   No respiratory distress   Abd soft, NT, ND Normal phallus with bilateral descended testicles. No significant testicular  tenderness today. Rectal: 30 cc prostate, rubbery lateral lobes, tenderness to palpation bilateral without bogginess.  Cystoscopy Procedure Note  Patient identification was confirmed, informed consent was obtained, and patient was prepped using Betadine solution.  Lidocaine jelly was administered per urethral meatus.    Preoperative abx where received prior to procedure.     Pre-Procedure: - Inspection reveals a normal caliber ureteral meatus.  Procedure: The flexible cystoscope was introduced without difficulty - No urethral strictures/lesions are present. - Normal prostate  - Normal bladder neck with mild hyperemia on the posterior bladder neck without extension into the trigone - Bilateral ureteral orifices identified - Bladder mucosa  reveals no ulcers, tumors, or lesions - No bladder stones - No trabeculation  Retroflexion shows hyperemia on bladder neck appreciated as above.   Post-Procedure: - Patient tolerated the procedure well  Assessment/ Plan:  1. Chronic prostatitis- Complete course of Cipro, unlikely infectious but would complete the course Recommend resumption of NSAIDS as tolerable from a GI standpoint Patient given samples of Uribell today (20 tabs) as well asToviaz 8 mg x 2 weeks to see if this might help PT therapy recommended- he will talk to a sister who has a connection with physical therapy Unwilling to prescribe narcotics for prostatitis Current consistency with bowel regimen and to avoid constipation, miralax docusate as needed We'll send GC/chlamydia for completeness  2. Gross hematuria Etiology unclear, some mild hyperemia of bladder neck ? Inflammatory We'll send out cytology although malignancy is not suspected  Follow up in 3-4 weeks  for recheck  Vanna Scotland, MD

## 2016-10-23 ENCOUNTER — Other Ambulatory Visit: Payer: Self-pay

## 2016-10-23 DIAGNOSIS — R310 Gross hematuria: Secondary | ICD-10-CM

## 2016-10-24 ENCOUNTER — Telehealth: Payer: Self-pay | Admitting: *Deleted

## 2016-10-24 LAB — CYTOLOGY - NON PAP

## 2016-10-24 NOTE — Telephone Encounter (Signed)
Spoke with patient and gave results. 

## 2016-10-24 NOTE — Telephone Encounter (Signed)
-----   Message from Vanna ScotlandAshley Brandon, MD sent at 10/24/2016  3:36 PM EDT ----- Urine cytology negative!  This is great news.    Vanna ScotlandAshley Brandon, MD

## 2016-11-06 ENCOUNTER — Encounter: Payer: Self-pay | Admitting: Emergency Medicine

## 2016-11-06 ENCOUNTER — Emergency Department
Admission: EM | Admit: 2016-11-06 | Discharge: 2016-11-06 | Disposition: A | Discharge status: Home / Self Care | Payer: Self-pay | Attending: Emergency Medicine | Admitting: Emergency Medicine

## 2016-11-06 DIAGNOSIS — Z79899 Other long term (current) drug therapy: Secondary | ICD-10-CM | POA: Insufficient documentation

## 2016-11-06 DIAGNOSIS — F17210 Nicotine dependence, cigarettes, uncomplicated: Secondary | ICD-10-CM | POA: Insufficient documentation

## 2016-11-06 DIAGNOSIS — L237 Allergic contact dermatitis due to plants, except food: Secondary | ICD-10-CM | POA: Insufficient documentation

## 2016-11-06 DIAGNOSIS — T7840XA Allergy, unspecified, initial encounter: Secondary | ICD-10-CM

## 2016-11-06 MED ORDER — HYDROXYZINE HCL 25 MG PO TABS: 25.0000 mg | ORAL_TABLET | Freq: Four times a day (QID) | ORAL | 0 refills | Status: DC | PRN

## 2016-11-06 MED ORDER — PREDNISONE 10 MG (21) PO TBPK: ORAL_TABLET | ORAL | 0 refills | Status: DC

## 2016-11-06 MED ORDER — DESONIDE 0.05 % EX CREA: TOPICAL_CREAM | CUTANEOUS | 1 refills | Status: DC

## 2016-11-06 MED ORDER — TRIAMCINOLONE ACETONIDE 40 MG/ML IJ SUSP
40.0000 mg | Freq: Once | INTRAMUSCULAR | Status: AC
  Administered 2016-11-06: 40 mg via INTRAMUSCULAR
  Filled 2016-11-06 (×2): qty 1

## 2016-11-06 MED ORDER — HYDROXYZINE HCL 25 MG PO TABS
25.0000 mg | ORAL_TABLET | Freq: Once | ORAL | Status: AC
  Administered 2016-11-06: 25 mg via ORAL
  Filled 2016-11-06: qty 1

## 2016-11-06 MED ORDER — FAMOTIDINE 20 MG PO TABS
20.0000 mg | ORAL_TABLET | Freq: Once | ORAL | Status: AC
  Administered 2016-11-06: 20 mg via ORAL
  Filled 2016-11-06: qty 1

## 2016-11-06 MED ORDER — DEXAMETHASONE SODIUM PHOSPHATE 10 MG/ML IJ SOLN
10.0000 mg | Freq: Once | INTRAMUSCULAR | Status: AC
  Administered 2016-11-06: 10 mg via INTRAMUSCULAR
  Filled 2016-11-06: qty 1
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
## 2016-11-16 ENCOUNTER — Other Ambulatory Visit: Payer: Self-pay

## 2016-11-16 DIAGNOSIS — R310 Gross hematuria: Secondary | ICD-10-CM

## 2016-11-17 ENCOUNTER — Ambulatory Visit: Payer: Self-pay | Admitting: Urology

## 2016-12-11 ENCOUNTER — Telehealth: Payer: Self-pay | Admitting: Family Medicine

## 2016-12-11 NOTE — Telephone Encounter (Signed)
Patient needs appointment. Left message for patient to cal back and schedule.

## 2016-12-11 NOTE — Telephone Encounter (Signed)
Patient called in reference to having a lot of fatigue. Patient wants to know where this is coming from. Stated its getting worse. Please call patient and advise. OK to leave message.

## 2016-12-12 ENCOUNTER — Encounter: Payer: Self-pay | Admitting: Urology

## 2016-12-12 ENCOUNTER — Ambulatory Visit (INDEPENDENT_AMBULATORY_CARE_PROVIDER_SITE_OTHER): Payer: Self-pay | Admitting: Urology

## 2016-12-12 VITALS — BP 163/89 | HR 69 | Ht 67.0 in | Wt 153.1 lb

## 2016-12-12 DIAGNOSIS — M6289 Other specified disorders of muscle: Secondary | ICD-10-CM

## 2016-12-12 DIAGNOSIS — R102 Pelvic and perineal pain: Secondary | ICD-10-CM
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
## 2017-04-16 ENCOUNTER — Encounter: Payer: Self-pay | Admitting: Emergency Medicine

## 2017-04-16 ENCOUNTER — Emergency Department: Payer: No Typology Code available for payment source

## 2017-04-16 ENCOUNTER — Emergency Department
Admission: EM | Admit: 2017-04-16 | Discharge: 2017-04-16 | Disposition: A | Discharge status: Home / Self Care | Payer: No Typology Code available for payment source | Attending: Emergency Medicine | Admitting: Emergency Medicine

## 2017-04-16 ENCOUNTER — Other Ambulatory Visit: Payer: Self-pay

## 2017-04-16 DIAGNOSIS — Z880 Allergy status to penicillin: Secondary | ICD-10-CM | POA: Insufficient documentation

## 2017-04-16 DIAGNOSIS — S9032XA Contusion of left foot, initial encounter: Secondary | ICD-10-CM | POA: Diagnosis not present

## 2017-04-16 DIAGNOSIS — Y9301 Activity, walking, marching and hiking: Secondary | ICD-10-CM | POA: Diagnosis not present

## 2017-04-16 DIAGNOSIS — Y999 Unspecified external cause status: Secondary | ICD-10-CM | POA: Diagnosis not present

## 2017-04-16 DIAGNOSIS — Y92410 Unspecified street and highway as the place of occurrence of the external cause: Secondary | ICD-10-CM | POA: Insufficient documentation

## 2017-04-16 DIAGNOSIS — F17210 Nicotine dependence, cigarettes, uncomplicated: Secondary | ICD-10-CM | POA: Insufficient documentation

## 2017-04-16 DIAGNOSIS — S99922A Unspecified injury of left foot, initial encounter: Secondary | ICD-10-CM | POA: Diagnosis present

## 2017-04-16 MED ORDER — NAPROXEN 500 MG PO TABS: 1000.0000 mg | ORAL_TABLET | Freq: Every day | ORAL | 0 refills | Status: DC

## 2017-04-16 MED ORDER — HYDROCODONE-ACETAMINOPHEN 5-325 MG PO TABS: 1.0000 | ORAL_TABLET | ORAL | 0 refills | Status: DC | PRN
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
## 2017-05-27 ENCOUNTER — Emergency Department
Admission: EM | Admit: 2017-05-27 | Discharge: 2017-05-27 | Disposition: A | Discharge status: Home / Self Care | Payer: Self-pay | Attending: Emergency Medicine | Admitting: Emergency Medicine

## 2017-05-27 ENCOUNTER — Other Ambulatory Visit: Payer: Self-pay

## 2017-05-27 ENCOUNTER — Encounter: Payer: Self-pay | Admitting: Emergency Medicine

## 2017-05-27 ENCOUNTER — Emergency Department: Payer: Self-pay

## 2017-05-27 DIAGNOSIS — S0990XA Unspecified injury of head, initial encounter: Secondary | ICD-10-CM | POA: Insufficient documentation

## 2017-05-27 DIAGNOSIS — T148XXA Other injury of unspecified body region, initial encounter: Secondary | ICD-10-CM

## 2017-05-27 DIAGNOSIS — F17210 Nicotine dependence, cigarettes, uncomplicated: Secondary | ICD-10-CM | POA: Insufficient documentation

## 2017-05-27 DIAGNOSIS — Y929 Unspecified place or not applicable: Secondary | ICD-10-CM | POA: Insufficient documentation

## 2017-05-27 DIAGNOSIS — Y998 Other external cause status: Secondary | ICD-10-CM | POA: Insufficient documentation

## 2017-05-27 DIAGNOSIS — Y9389 Activity, other specified: Secondary | ICD-10-CM | POA: Insufficient documentation

## 2017-05-27 DIAGNOSIS — T07XXXA Unspecified multiple injuries, initial encounter: Secondary | ICD-10-CM | POA: Insufficient documentation

## 2017-05-27 MED ORDER — OXYCODONE-ACETAMINOPHEN 5-325 MG PO TABS: 1.0000 | ORAL_TABLET | ORAL | 0 refills | Status: DC | PRN

## 2017-05-27 MED ORDER — OXYCODONE-ACETAMINOPHEN 5-325 MG PO TABS
1.0000 | ORAL_TABLET | Freq: Once | ORAL | Status: AC
  Administered 2017-05-27: 1 via ORAL
  Filled 2017-05-27: qty 1
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
## 2017-05-27 NOTE — ED Notes (Signed)
Pt up to sink washing hands with soap and water

## 2017-05-27 NOTE — ED Notes (Signed)
Face cleaned with Hibiclens and warm water. No obvious open areas multiple small cuts and scrapes. Right eye already getting red and bruised. Pt states difficult to breath through his right nostril. Feels sore and swollen.

## 2017-05-27 NOTE — Discharge Instructions (Signed)
Please follow-up with the acute care clinic for further evaluation of your symptoms. °
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
## 2017-12-30 ENCOUNTER — Encounter: Payer: Self-pay | Admitting: Emergency Medicine

## 2017-12-30 DIAGNOSIS — F419 Anxiety disorder, unspecified: Secondary | ICD-10-CM | POA: Insufficient documentation

## 2017-12-30 DIAGNOSIS — Z87891 Personal history of nicotine dependence: Secondary | ICD-10-CM | POA: Insufficient documentation

## 2017-12-30 DIAGNOSIS — F902 Attention-deficit hyperactivity disorder, combined type: Secondary | ICD-10-CM | POA: Insufficient documentation

## 2017-12-30 DIAGNOSIS — W540XXA Bitten by dog, initial encounter: Secondary | ICD-10-CM | POA: Insufficient documentation

## 2017-12-30 DIAGNOSIS — Z23 Encounter for immunization: Secondary | ICD-10-CM | POA: Insufficient documentation

## 2017-12-30 DIAGNOSIS — S60473A Other superficial bite of left middle finger, initial encounter: Secondary | ICD-10-CM | POA: Insufficient documentation

## 2017-12-30 DIAGNOSIS — Y9389 Activity, other specified: Secondary | ICD-10-CM | POA: Insufficient documentation

## 2017-12-30 DIAGNOSIS — Y998 Other external cause status: Secondary | ICD-10-CM | POA: Insufficient documentation

## 2017-12-30 DIAGNOSIS — Z2914 Encounter for prophylactic rabies immune globin: Secondary | ICD-10-CM | POA: Insufficient documentation

## 2017-12-30 DIAGNOSIS — F329 Major depressive disorder, single episode, unspecified: Secondary | ICD-10-CM | POA: Insufficient documentation

## 2017-12-30 DIAGNOSIS — Z203 Contact with and (suspected) exposure to rabies: Secondary | ICD-10-CM | POA: Insufficient documentation

## 2017-12-30 DIAGNOSIS — Y929 Unspecified place or not applicable: Secondary | ICD-10-CM | POA: Insufficient documentation

## 2017-12-30 NOTE — ED Triage Notes (Signed)
Patient states that he was bit by a stray dog. Patient with puncture wounds to left third finger. Patient with pain and swelling to left third finger.

## 2017-12-30 NOTE — ED Notes (Signed)
[employer] notified of dog bite.

## 2017-12-31 ENCOUNTER — Emergency Department: Payer: Self-pay

## 2017-12-31 ENCOUNTER — Emergency Department
Admission: EM | Admit: 2017-12-31 | Discharge: 2017-12-31 | Disposition: A | Discharge status: Home / Self Care | Payer: Self-pay | Attending: Emergency Medicine | Admitting: Emergency Medicine

## 2017-12-31 DIAGNOSIS — W540XXA Bitten by dog, initial encounter: Secondary | ICD-10-CM

## 2017-12-31 MED ORDER — RABIES VACCINE, PCEC IM SUSR
1.0000 mL | Freq: Once | INTRAMUSCULAR | Status: AC
  Administered 2017-12-31: 1 mL via INTRAMUSCULAR
  Filled 2017-12-31: qty 1

## 2017-12-31 MED ORDER — CLINDAMYCIN HCL 300 MG PO CAPS: 300.0000 mg | ORAL_CAPSULE | Freq: Three times a day (TID) | ORAL | 0 refills | Status: AC

## 2017-12-31 MED ORDER — CIPROFLOXACIN HCL 500 MG PO TABS: 500.0000 mg | ORAL_TABLET | Freq: Two times a day (BID) | ORAL | 0 refills | Status: AC

## 2017-12-31 MED ORDER — CIPROFLOXACIN HCL 500 MG PO TABS
500.0000 mg | ORAL_TABLET | Freq: Once | ORAL | Status: AC
  Administered 2017-12-31: 500 mg via ORAL
  Filled 2017-12-31: qty 1

## 2017-12-31 MED ORDER — CLINDAMYCIN HCL 150 MG PO CAPS
300.0000 mg | ORAL_CAPSULE | Freq: Once | ORAL | Status: AC
  Administered 2017-12-31: 300 mg via ORAL
  Filled 2017-12-31: qty 2

## 2017-12-31 MED ORDER — RABIES IMMUNE GLOBULIN 150 UNIT/ML IM INJ
20.0000 [IU]/kg | INJECTION | Freq: Once | INTRAMUSCULAR | Status: AC
  Administered 2017-12-31: 1425 [IU] via INTRAMUSCULAR
  Filled 2017-12-31: qty 9.5

## 2017-12-31 MED ORDER — OXYCODONE-ACETAMINOPHEN 5-325 MG PO TABS
1.0000 | ORAL_TABLET | Freq: Once | ORAL | Status: AC
  Administered 2017-12-31: 1 via ORAL
  Filled 2017-12-31: qty 1

## 2017-12-31 MED ORDER — RABIES VACCINE, PCEC IM SUSR: 1.0000 mL | Freq: Once | INTRAMUSCULAR | Status: DC

## 2017-12-31 NOTE — ED Notes (Signed)
Pt stated that a stray dog bit him on his right middle finger. Pt feels nauseous. Pt is alert and oriented x 4. Dr. Manson PasseyBrown at bedside. Family at bedside

## 2017-12-31 NOTE — ED Notes (Signed)
Pt updated on wait time by Elon JesterMichele, RN

## 2017-12-31 NOTE — ED Notes (Signed)
Pt waiting patiently for treatment room; warm blanket provided for visitor; pt declined
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
## 2018-01-08 ENCOUNTER — Other Ambulatory Visit: Payer: Self-pay

## 2018-01-08 ENCOUNTER — Emergency Department
Admission: EM | Admit: 2018-01-08 | Discharge: 2018-01-08 | Disposition: A | Discharge status: Home / Self Care | Payer: Self-pay | Attending: Emergency Medicine | Admitting: Emergency Medicine

## 2018-01-08 DIAGNOSIS — Z87891 Personal history of nicotine dependence: Secondary | ICD-10-CM | POA: Insufficient documentation

## 2018-01-08 DIAGNOSIS — Z203 Contact with and (suspected) exposure to rabies: Secondary | ICD-10-CM | POA: Insufficient documentation

## 2018-01-08 MED ORDER — RABIES VACCINE, PCEC IM SUSR
1.0000 mL | Freq: Once | INTRAMUSCULAR | Status: AC
  Administered 2018-01-08: 1 mL via INTRAMUSCULAR
  Filled 2018-01-08: qty 1

## 2018-01-08 NOTE — ED Notes (Signed)
See triage note  States he is here for rabies shots  Denies any new complaints

## 2018-01-08 NOTE — ED Triage Notes (Signed)
Pt arrives to ED for rabies shot and work note.
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
## 2018-08-19 ENCOUNTER — Telehealth: Payer: Self-pay | Admitting: Family Medicine

## 2018-08-19 NOTE — Telephone Encounter (Signed)
Pt not seen in over a year. Called pt and left vm to schedule appt

## 2018-11-17 ENCOUNTER — Encounter: Payer: Self-pay | Admitting: Emergency Medicine

## 2018-11-17 ENCOUNTER — Emergency Department
Admission: EM | Admit: 2018-11-17 | Discharge: 2018-11-17 | Disposition: A | Discharge status: Home / Self Care | Payer: Self-pay | Attending: Emergency Medicine | Admitting: Emergency Medicine

## 2018-11-17 ENCOUNTER — Emergency Department: Payer: Self-pay

## 2018-11-17 ENCOUNTER — Other Ambulatory Visit: Payer: Self-pay

## 2018-11-17 DIAGNOSIS — N432 Other hydrocele: Secondary | ICD-10-CM | POA: Insufficient documentation

## 2018-11-17 DIAGNOSIS — N50812 Left testicular pain: Secondary | ICD-10-CM | POA: Insufficient documentation

## 2018-11-17 DIAGNOSIS — N5089 Other specified disorders of the male genital organs: Secondary | ICD-10-CM

## 2018-11-17 DIAGNOSIS — Z87891 Personal history of nicotine dependence: Secondary | ICD-10-CM | POA: Insufficient documentation

## 2018-11-17 DIAGNOSIS — Z79899 Other long term (current) drug therapy: Secondary | ICD-10-CM | POA: Insufficient documentation

## 2018-11-17 MED ORDER — MORPHINE SULFATE (PF) 4 MG/ML IV SOLN
4.0000 mg | Freq: Once | INTRAVENOUS | Status: AC
  Administered 2018-11-17: 4 mg via INTRAVENOUS
  Filled 2018-11-17: qty 1

## 2018-11-17 MED ORDER — MORPHINE SULFATE (PF) 4 MG/ML IV SOLN
INTRAVENOUS | Status: AC
  Administered 2018-11-17: 4 mg via INTRAVENOUS
  Filled 2018-11-17: qty 1

## 2018-11-17 MED ORDER — MORPHINE SULFATE (PF) 4 MG/ML IV SOLN
4.0000 mg | Freq: Once | INTRAVENOUS | Status: AC
  Administered 2018-11-17: 4 mg via INTRAVENOUS

## 2018-11-17 MED ORDER — OXYCODONE-ACETAMINOPHEN 5-325 MG PO TABS: 1.0000 | ORAL_TABLET | ORAL | 0 refills | Status: DC | PRN

## 2018-11-17 MED ORDER — ONDANSETRON HCL 4 MG/2ML IJ SOLN
4.0000 mg | Freq: Once | INTRAMUSCULAR | Status: AC
  Administered 2018-11-17: 4 mg via INTRAVENOUS
  Filled 2018-11-17: qty 2

## 2018-11-17 NOTE — ED Notes (Signed)
Peripheral IV discontinued. Catheter intact. No signs of infiltration or redness. Gauze applied to IV site.   Discharge instructions reviewed with patient. Questions fielded by this RN. Patient verbalizes understanding of instructions. Patient discharged home in stable condition per paduchowski. No acute distress noted at time of discharge.   
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
## 2018-11-17 NOTE — ED Notes (Addendum)
Patient transported to Ultrasound 

## 2018-11-17 NOTE — ED Triage Notes (Signed)
Patient with complaint of left testicle pain and swelling that started about 12:00 today. Patient denies any injury.

## 2018-11-17 NOTE — ED Notes (Signed)
ED Provider at bedside.  Pt c/o pain like being "kicked in the nuts", left testicile appears twice the size of right, pt reports hx of left larger, and hx of right side hydrocele, pt reports sitting on the couch and watching movie when this pain started at noon, pt took 3 x IBU about 1800 without help  Pt denies lifting or discharge from penis,

## 2018-11-18 ENCOUNTER — Telehealth: Payer: Self-pay

## 2018-11-18 NOTE — Telephone Encounter (Signed)
I am not sure why Dr. Juleen China was removed. Pt was seen last in 2018 so he would just be hospital follow up since it has not been 3 years. Please schedule.

## 2018-11-18 NOTE — Telephone Encounter (Signed)
Pt called needing hosp f.u. I saw Juleen China was removed from PCP. Would pt need NP appt again to be seen? Or does pt need to establish with another provider? Please advise.

## 2018-11-18 NOTE — Telephone Encounter (Signed)
I spoke with patient and informed him of Dr. Alcario Drought response.

## 2018-11-18 NOTE — Telephone Encounter (Signed)
Please advise do you want to work in? Looks like patient only seen one time in our office.

## 2018-11-18 NOTE — Telephone Encounter (Signed)
Called pt to schedule hosp f.u. Soonest available is 7/22. Pt stated he has a hernia and a hydrocele that is very painful and the size of a papaya. Pt stated he has been using ice but it is painful and wants to know if this is ok. Please advise. PT needs to return to work ASAP or have a letter stating why he cant not return. I advised pt I would see if he can be seen sooner than 7/22 or what could be done as far as a note for work.

## 2018-11-18 NOTE — Telephone Encounter (Signed)
Needs to see Urologist.

## 2018-11-20 ENCOUNTER — Encounter: Payer: Self-pay | Admitting: Surgery

## 2018-11-20 ENCOUNTER — Other Ambulatory Visit: Payer: Self-pay

## 2018-11-20 ENCOUNTER — Ambulatory Visit: Payer: Self-pay | Admitting: Surgery

## 2018-11-20 VITALS — BP 126/87 | HR 96 | Temp 97.3°F | Ht 67.0 in | Wt 156.0 lb

## 2018-11-20 DIAGNOSIS — R1032 Left lower quadrant pain: Secondary | ICD-10-CM

## 2018-11-20 DIAGNOSIS — N433 Hydrocele, unspecified: Secondary | ICD-10-CM

## 2018-11-20 NOTE — Patient Instructions (Addendum)
Patient to have a cat scan done  . Follow up after his appt with Urology .  CT scan scheduled on   11/27/2018  @ 1:00 @[facility]   Nothing to eat or drink four hours prior. Pick up prep kit.
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
## 2018-11-27 ENCOUNTER — Ambulatory Visit: Admission: RE | Admit: 2018-11-27 | Payer: Self-pay | Source: Ambulatory Visit

## 2018-11-27 ENCOUNTER — Ambulatory Visit
Admission: RE | Admit: 2018-11-27 | Discharge: 2018-11-27 | Disposition: A | Discharge status: Home / Self Care | Payer: Self-pay | Source: Ambulatory Visit | Attending: Surgery | Admitting: Surgery

## 2018-11-27 ENCOUNTER — Other Ambulatory Visit: Payer: Self-pay

## 2018-11-27 DIAGNOSIS — N433 Hydrocele, unspecified: Secondary | ICD-10-CM | POA: Insufficient documentation

## 2018-11-27 MED ORDER — IOHEXOL 300 MG/ML  SOLN
100.0000 mL | Freq: Once | INTRAMUSCULAR | Status: AC | PRN
  Administered 2018-11-27: 100 mL via INTRAVENOUS

## 2018-11-29 ENCOUNTER — Ambulatory Visit (INDEPENDENT_AMBULATORY_CARE_PROVIDER_SITE_OTHER): Payer: Self-pay | Admitting: Urology

## 2018-11-29 ENCOUNTER — Encounter: Payer: Self-pay | Admitting: Urology

## 2018-11-29 ENCOUNTER — Other Ambulatory Visit: Payer: Self-pay

## 2018-11-29 VITALS — BP 127/87 | HR 75 | Ht 67.0 in | Wt 154.9 lb

## 2018-11-29 DIAGNOSIS — N5082 Scrotal pain: Secondary | ICD-10-CM

## 2018-11-29 DIAGNOSIS — N430 Encysted hydrocele: Secondary | ICD-10-CM
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
## 2018-12-02 ENCOUNTER — Telehealth: Payer: Self-pay | Admitting: Surgery

## 2018-12-02 NOTE — Telephone Encounter (Signed)
Ray Rosales is calling and asked if his results have came in for his ct results.Ray Rosales said Dr. Hampton Abbot was going to talk to urology.  Please call Ray Rosales and advise.

## 2018-12-02 NOTE — Telephone Encounter (Signed)
Patient to come on on 12/13/2018 to talk with Dr. Hampton Abbot about ct scan . Want to discuss surgery

## 2018-12-03 ENCOUNTER — Telehealth: Payer: Self-pay | Admitting: Urology

## 2018-12-03 ENCOUNTER — Telehealth: Payer: Self-pay | Admitting: *Deleted

## 2018-12-03 NOTE — Telephone Encounter (Signed)
Notified patient as instructed,He don't need an appt with Dr. Hampton Abbot. C/X appt with Dr. Hampton Abbot.  Discussed follow-up appointments, patient agrees

## 2018-12-03 NOTE — Telephone Encounter (Signed)
Pt called and stated Dr. Ricki Rodriguez has told the pt to refer back urology for his hydrocele.  Please advise how and when to schedule this pt.  Thanks.

## 2018-12-05 ENCOUNTER — Encounter: Payer: Self-pay | Admitting: Urology

## 2018-12-05 DIAGNOSIS — N5082 Scrotal pain: Secondary | ICD-10-CM | POA: Insufficient documentation

## 2018-12-05 DIAGNOSIS — N430 Encysted hydrocele: Secondary | ICD-10-CM | POA: Insufficient documentation

## 2018-12-10 ENCOUNTER — Ambulatory Visit: Payer: Self-pay | Admitting: Urology

## 2018-12-13 ENCOUNTER — Ambulatory Visit: Payer: Self-pay | Admitting: Surgery

## 2018-12-30 ENCOUNTER — Other Ambulatory Visit: Payer: Self-pay

## 2018-12-30 ENCOUNTER — Encounter: Payer: Self-pay | Admitting: Urology

## 2018-12-30 ENCOUNTER — Ambulatory Visit (INDEPENDENT_AMBULATORY_CARE_PROVIDER_SITE_OTHER): Payer: Self-pay | Admitting: Urology

## 2018-12-30 VITALS — BP 123/74 | HR 76 | Ht 67.0 in | Wt 160.0 lb

## 2018-12-30 DIAGNOSIS — N433 Hydrocele, unspecified: Secondary | ICD-10-CM

## 2018-12-30 NOTE — Progress Notes (Signed)
43 year old male seen 11/29/2018 for a left hydrocele with a long history of chronic left scrotal content pain.  He initially desired hydrocelectomy however presented today stating he wanted to try hydrocele aspiration.  He wanted to wait until he has insurance insurance coverage in approximately 6 weeks.  We discussed hydrocele aspiration.  We will initially plan straight aspiration.  The likelihood of reaccumulation was discussed.  If aspiration resolves his pain and he has reaccumulation could proceed with hydrocelectomy or repeat aspiration with sclerotherapy.  He indicated all questions were answered and desires to proceed.  Possibility of bleeding and infection were both discussed.

## 2018-12-31 ENCOUNTER — Ambulatory Visit: Payer: Self-pay | Admitting: Urology

## 2019-02-10 ENCOUNTER — Other Ambulatory Visit: Payer: Self-pay

## 2019-02-10 ENCOUNTER — Telehealth: Payer: Self-pay

## 2019-02-10 ENCOUNTER — Encounter (INDEPENDENT_AMBULATORY_CARE_PROVIDER_SITE_OTHER): Payer: Self-pay

## 2019-02-10 ENCOUNTER — Telehealth: Payer: Self-pay | Admitting: Nurse Practitioner

## 2019-02-10 DIAGNOSIS — R059 Cough, unspecified: Secondary | ICD-10-CM

## 2019-02-10 DIAGNOSIS — R05 Cough: Secondary | ICD-10-CM

## 2019-02-10 DIAGNOSIS — Z20822 Contact with and (suspected) exposure to covid-19: Secondary | ICD-10-CM

## 2019-02-10 DIAGNOSIS — Z20828 Contact with and (suspected) exposure to other viral communicable diseases: Secondary | ICD-10-CM

## 2019-02-10 DIAGNOSIS — R0602 Shortness of breath: Secondary | ICD-10-CM

## 2019-02-10 MED ORDER — ALBUTEROL SULFATE HFA 108 (90 BASE) MCG/ACT IN AERS: 2.0000 | INHALATION_SPRAY | Freq: Four times a day (QID) | RESPIRATORY_TRACT | 0 refills | Status: DC | PRN

## 2019-02-10 MED ORDER — BENZONATATE 100 MG PO CAPS: 100.0000 mg | ORAL_CAPSULE | Freq: Three times a day (TID) | ORAL | 0 refills | Status: DC | PRN

## 2019-02-10 NOTE — Telephone Encounter (Signed)
Patient advise per protocol on cough, diarrhea, weakness:  IF PATIENT HAS WORSENING WEAKNESS WITH INABILITY TO STAND OR IF PATIENT HAS TO HOLD ON TO SOMETHING TO GET BALANCE, ADVISE PATIENT TO CALL 911 AND SEEK TREATMENT IN ED  If diarrhea remains the same: encourage patient to drink oral fluids and bland foods.   Avoid alcohol, spicy foods, caffeine or fatty foods that could make diarrhea worse.   Continue to monitor for signs of dehydration (increased thirst decreased urine output, yellow urine, dry skin, headache or dizziness).   Advise patient to try OTC medication (Imodium, kaopectate, Pepto-Bismol) as per manufacturer's instructions.  f worsening diarrhea occurs and becomes severe (6-7 bowel movements a day): notify PCP   If diarrhea last greater than 7 days: notify PCP   IF SIGNS OF DEHYDRATION OCCUR (INCREASED THIRST, DECREASED URINE OUTPUT, YELLOW URINE, DRY SKIN, HEADACHE OR DIZZINESS) ADVISE PATIENT TO CALL 911 AND SEEK TREATMENT IN THE ED  If cough remains the same or better: continue to treat with over the counter medications. Hard candy or cough drops and drinking warm fluids. Adults can also use honey 2 tsp (10 ML) at bedtime.   HONEY IS NOT RECOMMENDED FOR INFANTS UNDER ONE   If cough is becoming worse even with the use of over the counter medications and patient is not able to sleep at night, cough becomes productive with sputum that maybe yellow or green in color, contact PCP.  Patient verbalized understanding and agrees with care plan.

## 2019-02-10 NOTE — Progress Notes (Signed)
E-Visit for Corona Virus Screening   Your current symptoms could be consistent with the coronavirus.  Many health care providers can now test patients at their office but not all are.  [facility] has multiple testing sites. For information on our COVID testing locations and hours go to [email or web address]  Please quarantine yourself while awaiting your test results.  We are enrolling you in our [place] for [place] . Daily you will receive a questionnaire within the [facility] website. Our COVID 19 response team willl be monitoriing your responses daily.  You can go to one of the  testing sites listed below, while they are opened (see hours). You do not need a doctors order to be tested for covid.You do need to self-isolate until your results return and if positive 14 days from when your symptoms started and until you are 3 days symptom free.   Testing Locations (Monday - Friday, 8 a.m. - 3:30 p.m.) . [place]: [facility], [address] [place], [place]: [place], [place], [place], [place] (entrance off [address])  . [place] [address] [place], [place] (across from [facility] Department)   COVID-19 is a respiratory illness with symptoms that are similar to the flu. Symptoms are typically mild to moderate, but there have been cases of severe illness and death due to the virus. The following symptoms may appear 2-14 days after exposure: . Fever . Cough . Shortness of breath or difficulty breathing . Chills . Repeated shaking with chills . Muscle pain . Headache . Sore throat . New loss of taste or smell . Fatigue . Congestion or runny nose . Nausea or vomiting . Diarrhea  It is vitally important that if you feel that you have an infection such as this virus or any other virus that you stay home and away from places where you may spread  it to others.  You should self-quarantine for 14 days if you have symptoms that could potentially be coronavirus or have been in close contact a with a person diagnosed with COVID-19 within the last 2 weeks. You should avoid contact with people age 41 and older.   You should wear a mask or cloth face covering over your nose and mouth if you must be around other people or animals, including pets (even at home). Try to stay at least 6 feet away from other people. This will protect the people around you.  You can use medication such as A prescription cough medication called Tessalon Perles 100 mg. You may take 1-2 capsules every 8 hours as needed for cough and A prescription inhaler called Albuterol MDI 90 mcg /actuation 2 puffs every 4 hours as needed for shortness of breath, wheezing, cough  You may also take acetaminophen (Tylenol) as needed for fever.   Reduce your risk of any infection by using the same precautions used for avoiding the common cold or flu:  Marland Kitchen Wash your hands often with soap and warm water for at least 20 seconds.  If soap and water are not readily available, use an alcohol-based hand sanitizer with at least 60% alcohol.  . If coughing or sneezing, cover your mouth and nose by coughing or sneezing into the elbow areas of your shirt or coat, into a tissue or into your sleeve (not your hands). . Avoid shaking hands with others and consider head nods or verbal greetings only. . Avoid touching  your eyes, nose, or mouth with unwashed hands.  . Avoid close contact with people who are sick. . Avoid places or events with large numbers of people in one location, like concerts or sporting events. . Carefully consider travel plans you have or are making. . If you are planning any travel outside or inside the [place], visit the CDC's Travelers' Health webpage for the latest health notices. . If you have some symptoms but not all symptoms, continue to monitor at home and seek medical attention if  your symptoms worsen. . If you are having a medical emergency, call 911.  HOME CARE . Only take medications as instructed by your medical team. . Drink plenty of fluids and get plenty of rest. . A steam or ultrasonic humidifier can help if you have congestion.   GET HELP RIGHT AWAY IF YOU HAVE EMERGENCY WARNING SIGNS** FOR COVID-19. If you or someone is showing any of these signs seek emergency medical care immediately. Call 911 or proceed to your closest emergency facility if: . You develop worsening high fever. . Trouble breathing . Bluish lips or face . Persistent pain or pressure in the chest . New confusion . Inability to wake or stay awake . You cough up blood. . Your symptoms become more severe  **This list is not all possible symptoms. Contact your medical provider for any symptoms that are sever or concerning to you.   MAKE SURE YOU   Understand these instructions.  Will watch your condition.  Will get help right away if you are not doing well or get worse.  Your e-visit answers were reviewed by a board certified advanced clinical practitioner to complete your personal care plan.  Depending on the condition, your plan could have included both over the counter or prescription medications.  If there is a problem please reply once you have received a response from your provider.  Your safety is important to [place].  If you have drug allergies check your prescription carefully.    You can use MyChart to ask questions about today's visit, request a non-urgent call back, or ask for a work or school excuse for 24 hours related to this e-Visit. If it has been greater than 24 hours you will need to follow up with your provider, or enter a new e-Visit to address those concerns. You will get an e-mail in the next two days asking about your experience.  I hope that your e-visit has been valuable and will speed your recovery. Thank you for using e-visits.   5-10 minutes spent reviewing  and documenting in chart.

## 2019-02-12 ENCOUNTER — Telehealth: Payer: Self-pay | Admitting: Family

## 2019-02-12 ENCOUNTER — Encounter (INDEPENDENT_AMBULATORY_CARE_PROVIDER_SITE_OTHER): Payer: Self-pay

## 2019-02-12 ENCOUNTER — Telehealth: Payer: Self-pay

## 2019-02-12 DIAGNOSIS — J069 Acute upper respiratory infection, unspecified: Secondary | ICD-10-CM

## 2019-02-12 LAB — NOVEL CORONAVIRUS, NAA: SARS-CoV-2, NAA: NOT DETECTED

## 2019-02-12 MED ORDER — FLUTICASONE PROPIONATE 50 MCG/ACT NA SUSP: 2.0000 | Freq: Every day | NASAL | 6 refills | Status: DC

## 2019-02-12 MED ORDER — BENZONATATE 100 MG PO CAPS: 100.0000 mg | ORAL_CAPSULE | Freq: Three times a day (TID) | ORAL | 0 refills | Status: DC | PRN

## 2019-02-12 NOTE — Progress Notes (Signed)
We are sorry you are not feeling well.  Here is how we plan to help!  Based on what you have shared with me, it looks like you may have a viral upper respiratory infection.  Upper respiratory infections are caused by a large number of viruses; however, rhinovirus is the most common cause.   Symptoms vary from person to person, with common symptoms including sore throat, cough, fatigue or lack of energy and feeling of general discomfort.  A low-grade fever of up to 100.4 may present, but is often uncommon.  Symptoms vary however, and are closely related to a person's age or underlying illnesses.  The most common symptoms associated with an upper respiratory infection are nasal discharge or congestion, cough, sneezing, headache and pressure in the ears and face.  These symptoms usually persist for about 3 to 10 days, but can last up to 2 weeks.  It is important to know that upper respiratory infections do not cause serious illness or complications in most cases.    Upper respiratory infections can be transmitted from person to person, with the most common method of transmission being a person's hands.  The virus is able to live on the skin and can infect other persons for up to 2 hours after direct contact.  Also, these can be transmitted when someone coughs or sneezes; thus, it is important to cover the mouth to reduce this risk.  To keep the spread of the illness at bay, good hand hygiene is very important.  This is an infection that is most likely caused by a virus. There are no specific treatments other than to help you with the symptoms until the infection runs its course.  We are sorry you are not feeling well.  Here is how we plan to help!   For nasal congestion, you may use an oral decongestants such as Mucinex D or if you have glaucoma or high blood pressure use plain Mucinex.  Saline nasal spray or nasal drops can help and can safely be used as often as needed for congestion.  For your  congestion, I have prescribed Fluticasone nasal spray one spray in each nostril twice a day  If you do not have a history of heart disease, hypertension, diabetes or thyroid disease, prostate/bladder issues or glaucoma, you may also use Sudafed to treat nasal congestion.  It is highly recommended that you consult with a pharmacist or your primary care physician to ensure this medication is safe for you to take.     If you have a cough, you may use cough suppressants such as Delsym and Robitussin.  If you have glaucoma or high blood pressure, you can also use Coricidin HBP.   For cough I have prescribed for you A prescription cough medication called Tessalon Perles 100 mg. You may take 1-2 capsules every 8 hours as needed for cough.  If you have a sore or scratchy throat, use a saltwater gargle-  to  teaspoon of salt dissolved in a 4-ounce to 8-ounce glass of warm water.  Gargle the solution for approximately 15-30 seconds and then spit.  It is important not to swallow the solution.  You can also use throat lozenges/cough drops and Chloraseptic spray to help with throat pain or discomfort.  Warm or cold liquids can also be helpful in relieving throat pain.  For headache, pain or general discomfort, you can use Ibuprofen or Tylenol as directed.   Some authorities believe that zinc sprays or the use of  Echinacea may shorten the course of your symptoms.   HOME CARE . Only take medications as instructed by your medical team. . Be sure to drink plenty of fluids. Water is fine as well as fruit juices, sodas and electrolyte beverages. You may want to stay away from caffeine or alcohol. If you are nauseated, try taking small sips of liquids. How do you know if you are getting enough fluid? Your urine should be a pale yellow or almost colorless. . Get rest. . Taking a steamy shower or using a humidifier may help nasal congestion and ease sore throat pain. You can place a towel over your head and breathe in  the steam from hot water coming from a faucet. . Using a saline nasal spray works much the same way. . Cough drops, hard candies and sore throat lozenges may ease your cough. . Avoid close contacts especially the very young and the elderly . Cover your mouth if you cough or sneeze . Always remember to wash your hands.   GET HELP RIGHT AWAY IF: . You develop worsening fever. . If your symptoms do not improve within 10 days . You develop yellow or green discharge from your nose over 3 days. . You have coughing fits . You develop a severe head ache or visual changes. . You develop shortness of breath, difficulty breathing or start having chest pain . Your symptoms persist after you have completed your treatment plan  MAKE SURE YOU   Understand these instructions.  Will watch your condition.  Will get help right away if you are not doing well or get worse.   Approximately 5 minutes was spent documenting and reviewing patient's chart.    Your e-visit answers were reviewed by a board certified advanced clinical practitioner to complete your personal care plan. Depending upon the condition, your plan could have included both over the counter or prescription medications. Please review your pharmacy choice. If there is a problem, you may call our nursing hot line at and have the prescription routed to another pharmacy. Your safety is important to [place]. If you have drug allergies check your prescription carefully.   You can use MyChart to ask questions about today's visit, request a non-urgent call back, or ask for a work or school excuse for 24 hours related to this e-Visit. If it has been greater than 24 hours you will need to follow up with your provider, or enter a new e-Visit to address those concerns. You will get an e-mail in the next two days asking about your experience.  I hope that your e-visit has been valuable and will speed your recovery. Thank you for using e-visits.

## 2019-02-12 NOTE — Telephone Encounter (Signed)
Patient advise on SOB per protocol:  Shortness of breath is the same: continue to monitor at home   If symptoms become severe, i.e. shortness of breath at rest, gasping for air, wheezing, CALL 911 AND SEEK TREATMENT IN THE ED  Patient verbalized understanding and agrees with plan.  

## 2019-05-18 ENCOUNTER — Emergency Department
Admission: EM | Admit: 2019-05-18 | Discharge: 2019-05-18 | Disposition: A | Discharge status: Home / Self Care | Payer: BC Managed Care – PPO | Attending: Emergency Medicine | Admitting: Emergency Medicine

## 2019-05-18 ENCOUNTER — Emergency Department: Payer: BC Managed Care – PPO

## 2019-05-18 ENCOUNTER — Other Ambulatory Visit: Payer: Self-pay

## 2019-05-18 ENCOUNTER — Encounter: Payer: Self-pay | Admitting: Emergency Medicine

## 2019-05-18 DIAGNOSIS — Y9289 Other specified places as the place of occurrence of the external cause: Secondary | ICD-10-CM | POA: Insufficient documentation

## 2019-05-18 DIAGNOSIS — Y990 Civilian activity done for income or pay: Secondary | ICD-10-CM | POA: Diagnosis not present

## 2019-05-18 DIAGNOSIS — Z87891 Personal history of nicotine dependence: Secondary | ICD-10-CM | POA: Insufficient documentation

## 2019-05-18 DIAGNOSIS — Y9389 Activity, other specified: Secondary | ICD-10-CM | POA: Insufficient documentation

## 2019-05-18 DIAGNOSIS — M5441 Lumbago with sciatica, right side: Secondary | ICD-10-CM

## 2019-05-18 DIAGNOSIS — X500XXA Overexertion from strenuous movement or load, initial encounter: Secondary | ICD-10-CM | POA: Diagnosis not present

## 2019-05-18 DIAGNOSIS — M545 Low back pain: Secondary | ICD-10-CM | POA: Diagnosis present

## 2019-05-18 MED ORDER — HYDROCODONE-ACETAMINOPHEN 5-325 MG PO TABS
1.0000 | ORAL_TABLET | Freq: Once | ORAL | Status: AC
  Administered 2019-05-18: 15:00:00 1 via ORAL
  Filled 2019-05-18: qty 1

## 2019-05-18 MED ORDER — HYDROCODONE-ACETAMINOPHEN 5-325 MG PO TABS: 1.0000 | ORAL_TABLET | Freq: Three times a day (TID) | ORAL | 0 refills | Status: DC | PRN

## 2019-05-18 MED ORDER — DEXAMETHASONE SODIUM PHOSPHATE 10 MG/ML IJ SOLN
10.0000 mg | Freq: Once | INTRAMUSCULAR | Status: AC
  Administered 2019-05-18: 10 mg via INTRAMUSCULAR
  Filled 2019-05-18: qty 1

## 2019-05-18 MED ORDER — PREDNISONE 10 MG PO TABS: ORAL_TABLET | ORAL | 0 refills | Status: DC

## 2019-05-18 MED ORDER — CYCLOBENZAPRINE HCL 10 MG PO TABS: 10.0000 mg | ORAL_TABLET | Freq: Three times a day (TID) | ORAL | 0 refills | Status: DC | PRN

## 2019-05-18 MED ORDER — METHOCARBAMOL 500 MG PO TABS
500.0000 mg | ORAL_TABLET | Freq: Once | ORAL | Status: AC
  Administered 2019-05-18: 500 mg via ORAL
  Filled 2019-05-18: qty 1
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
## 2019-05-18 NOTE — ED Triage Notes (Signed)
Pt says attempted to lift something and has back pain - injured Friday am.

## 2019-05-18 NOTE — Discharge Instructions (Signed)
You were seen today for acute low back pain.  Your x-ray is negative.  I have given you prescriptions for anti-inflammatories, muscle relaxers and pain medication.  Please take as prescribed.  Avoid NSAIDs OTC.  Ice for 10 minutes 3 times a day may be helpful.  Stretching and massage may also be helpful.

## 2020-08-30 ENCOUNTER — Emergency Department: Payer: BC Managed Care – PPO

## 2020-08-30 ENCOUNTER — Emergency Department
Admission: EM | Admit: 2020-08-30 | Discharge: 2020-08-30 | Disposition: A | Discharge status: Home / Self Care | Payer: BC Managed Care – PPO | Attending: Emergency Medicine | Admitting: Emergency Medicine

## 2020-08-30 ENCOUNTER — Encounter: Payer: Self-pay | Admitting: Emergency Medicine

## 2020-08-30 ENCOUNTER — Other Ambulatory Visit: Payer: Self-pay

## 2020-08-30 DIAGNOSIS — S99921A Unspecified injury of right foot, initial encounter: Secondary | ICD-10-CM | POA: Insufficient documentation

## 2020-08-30 DIAGNOSIS — W2209XA Striking against other stationary object, initial encounter: Secondary | ICD-10-CM | POA: Diagnosis not present

## 2020-08-30 DIAGNOSIS — Z87891 Personal history of nicotine dependence: Secondary | ICD-10-CM | POA: Diagnosis not present

## 2020-08-30 MED ORDER — ACETAMINOPHEN 500 MG PO TABS
1000.0000 mg | ORAL_TABLET | Freq: Once | ORAL | Status: AC
  Administered 2020-08-30: 1000 mg via ORAL
  Filled 2020-08-30: qty 2

## 2020-08-30 MED ORDER — IBUPROFEN 400 MG PO TABS
400.0000 mg | ORAL_TABLET | Freq: Once | ORAL | Status: AC
  Administered 2020-08-30: 400 mg via ORAL
  Filled 2020-08-30: qty 1

## 2020-08-30 NOTE — ED Notes (Signed)
See triage note  Presents with injury to right great toe  States hit his foot on the wall  bruising and swelling noted
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
## 2020-08-30 NOTE — ED Triage Notes (Signed)
Patient to ER for c/o right big toe injury after hitting on stairs last night while going after his dog.

## 2020-09-09 ENCOUNTER — Emergency Department: Payer: BC Managed Care – PPO

## 2020-09-09 ENCOUNTER — Emergency Department
Admission: EM | Admit: 2020-09-09 | Discharge: 2020-09-09 | Disposition: A | Discharge status: Home / Self Care | Payer: BC Managed Care – PPO | Attending: Emergency Medicine | Admitting: Emergency Medicine

## 2020-09-09 ENCOUNTER — Other Ambulatory Visit: Payer: Self-pay

## 2020-09-09 DIAGNOSIS — R109 Unspecified abdominal pain: Secondary | ICD-10-CM | POA: Diagnosis present

## 2020-09-09 DIAGNOSIS — Z87442 Personal history of urinary calculi: Secondary | ICD-10-CM | POA: Insufficient documentation

## 2020-09-09 DIAGNOSIS — N390 Urinary tract infection, site not specified: Secondary | ICD-10-CM | POA: Diagnosis not present

## 2020-09-09 DIAGNOSIS — K5900 Constipation, unspecified: Secondary | ICD-10-CM | POA: Insufficient documentation

## 2020-09-09 DIAGNOSIS — R1084 Generalized abdominal pain: Secondary | ICD-10-CM

## 2020-09-09 DIAGNOSIS — Z87891 Personal history of nicotine dependence: Secondary | ICD-10-CM | POA: Insufficient documentation

## 2020-09-09 LAB — CBC
HCT: 43.3 % (ref 39.0–52.0)
Hemoglobin: 15.1 g/dL (ref 13.0–17.0)
MCH: 31.7 pg (ref 26.0–34.0)
MCHC: 34.9 g/dL (ref 30.0–36.0)
MCV: 91 fL (ref 80.0–100.0)
Platelets: 238 10*3/uL (ref 150–400)
RBC: 4.76 MIL/uL (ref 4.22–5.81)
RDW: 12.6 % (ref 11.5–15.5)
WBC: 4.9 10*3/uL (ref 4.0–10.5)
nRBC: 0 % (ref 0.0–0.2)

## 2020-09-09 LAB — URINALYSIS, COMPLETE (UACMP) WITH MICROSCOPIC
Bilirubin Urine: NEGATIVE
Glucose, UA: 50 mg/dL — AB
Hgb urine dipstick: NEGATIVE
Ketones, ur: NEGATIVE mg/dL
Leukocytes,Ua: NEGATIVE
Nitrite: POSITIVE — AB
Protein, ur: NEGATIVE mg/dL
Specific Gravity, Urine: 1.017 (ref 1.005–1.030)
Squamous Epithelial / HPF: NONE SEEN (ref 0–5)
pH: 7 (ref 5.0–8.0)

## 2020-09-09 LAB — COMPREHENSIVE METABOLIC PANEL
ALT: 14 U/L (ref 0–44)
AST: 18 U/L (ref 15–41)
Albumin: 4.7 g/dL (ref 3.5–5.0)
Alkaline Phosphatase: 48 U/L (ref 38–126)
Anion gap: 9 (ref 5–15)
BUN: 16 mg/dL (ref 6–20)
CO2: 28 mmol/L (ref 22–32)
Calcium: 9.2 mg/dL (ref 8.9–10.3)
Chloride: 101 mmol/L (ref 98–111)
Creatinine, Ser: 0.91 mg/dL (ref 0.61–1.24)
GFR, Estimated: >60 mL/min (ref >60)
Glucose, Bld: 100 mg/dL — ABNORMAL HIGH (ref 70–99)
Potassium: 4.3 mmol/L (ref 3.5–5.1)
Sodium: 138 mmol/L (ref 135–145)
Total Bilirubin: 0.8 mg/dL (ref 0.3–1.2)
Total Protein: 7.4 g/dL (ref 6.5–8.1)

## 2020-09-09 LAB — LIPASE, BLOOD: Lipase: 26 U/L (ref 11–51)

## 2020-09-09 MED ORDER — SODIUM CHLORIDE 0.9 % IV SOLN
1.0000 g | Freq: Once | INTRAVENOUS | Status: AC
  Administered 2020-09-09: 1 g via INTRAVENOUS
  Filled 2020-09-09: qty 10

## 2020-09-09 MED ORDER — CEPHALEXIN 500 MG PO CAPS: 500.0000 mg | ORAL_CAPSULE | Freq: Two times a day (BID) | ORAL | 0 refills | Status: DC

## 2020-09-09 MED ORDER — DOCUSATE SODIUM 100 MG PO CAPS: 100.0000 mg | ORAL_CAPSULE | Freq: Two times a day (BID) | ORAL | 0 refills | Status: AC

## 2020-09-09 MED ORDER — SODIUM CHLORIDE 0.9 % IV BOLUS
1000.0000 mL | Freq: Once | INTRAVENOUS | Status: AC
  Administered 2020-09-09: 1000 mL via INTRAVENOUS

## 2020-09-09 MED ORDER — DOCUSATE SODIUM 100 MG PO CAPS: 100.0000 mg | ORAL_CAPSULE | Freq: Two times a day (BID) | ORAL | 0 refills | Status: DC

## 2020-09-09 MED ORDER — POLYETHYLENE GLYCOL 3350 17 GM/SCOOP PO POWD: 17.0000 g | Freq: Two times a day (BID) | ORAL | 0 refills | Status: DC | PRN

## 2020-09-09 MED ORDER — ONDANSETRON HCL 4 MG/2ML IJ SOLN
4.0000 mg | Freq: Once | INTRAMUSCULAR | Status: AC
  Administered 2020-09-09: 4 mg via INTRAVENOUS
  Filled 2020-09-09: qty 2

## 2020-09-09 MED ORDER — MORPHINE SULFATE (PF) 4 MG/ML IV SOLN
4.0000 mg | Freq: Once | INTRAVENOUS | Status: AC
  Administered 2020-09-09: 4 mg via INTRAVENOUS
  Filled 2020-09-09: qty 1

## 2020-09-09 NOTE — ED Notes (Signed)
Night nurse at bedside obtaining history from pt.
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
## 2020-09-09 NOTE — ED Triage Notes (Signed)
Pt to ER via POV with complaints of RUQ pain that radiates into R lower back. Pt reports pain has been intermittent over the past 5 days. States when it hits it is intense, past 2 days it has been at its worst. Worse after eating. Reports still has gall bladder.   Reports constant nausea. Denies diarrhea/ vomiting. Pt reports urinary pressure but no burning. States has been taking azo with no relief in symptoms.

## 2020-09-09 NOTE — ED Notes (Signed)
Pt back from CT, reports 4/10 pain

## 2020-09-11 LAB — URINE CULTURE: Culture: <10000 — AB

## 2022-12-25 IMAGING — CT CT RENAL STONE PROTOCOL
2 of 4 series · 16 of 46 positions shown, 18 images · non-contrast
Comparison: 11/27/2018

CLINICAL DATA: Right upper quadrant pain radiating to right lower
back, pain has been intermittent over the last 5 days

EXAM:
CT ABDOMEN AND PELVIS WITHOUT CONTRAST
TECHNIQUE: Multidetector CT imaging of the abdomen and pelvis was performed
following the standard protocol without IV contrast.

[Series 2: stone full standard · axial · 0.71mm/px · z∈[-434,-14]mm · 13 of 92 slices shown, 15 images]
[im 4/92  soft-tissue]
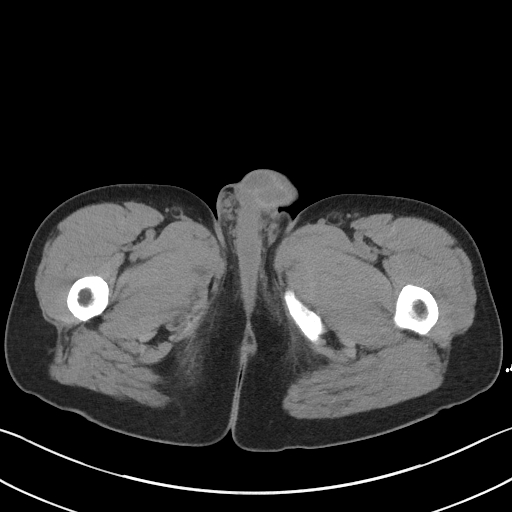
[im 4/92  bone]
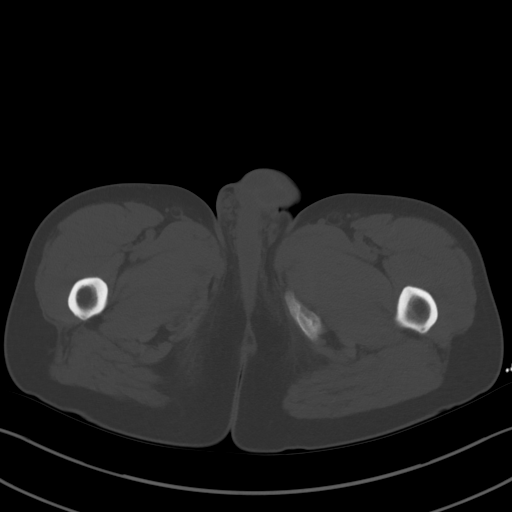
[im 11/92  soft-tissue]
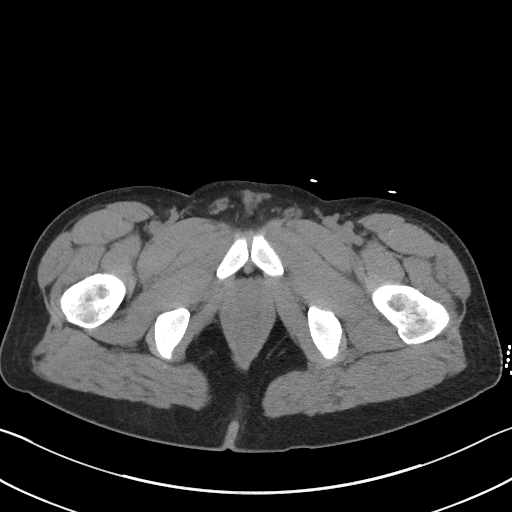
[im 18/92  soft-tissue]
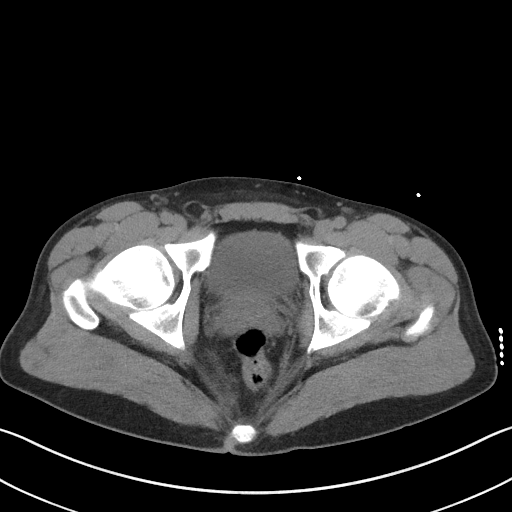
[im 25/92  soft-tissue]
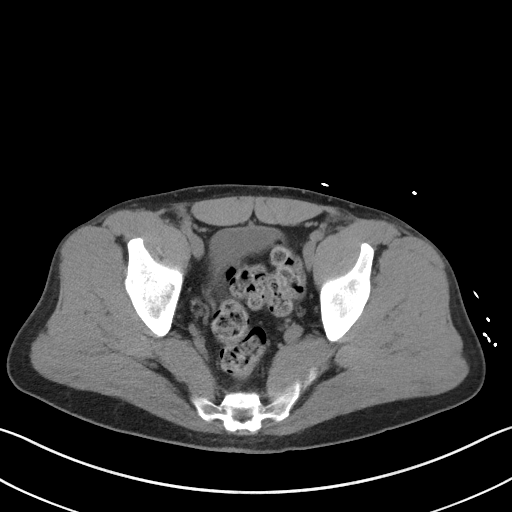
[im 32/92  soft-tissue]
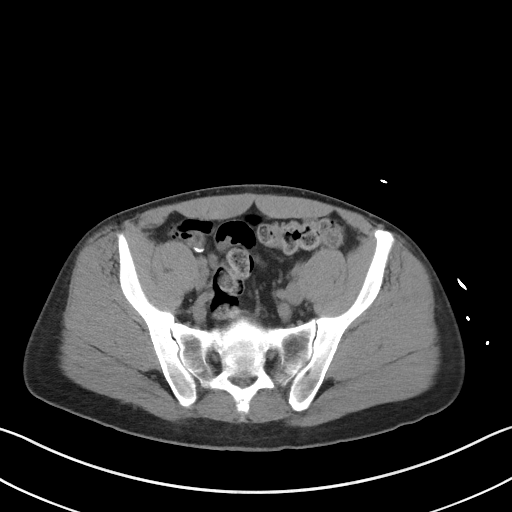
[im 39/92  soft-tissue]
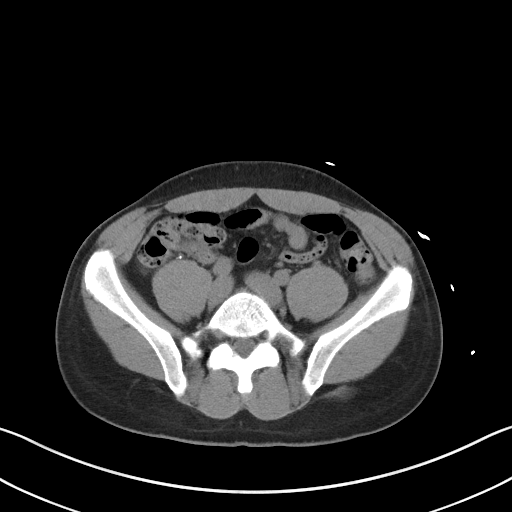
[im 46/92  soft-tissue]
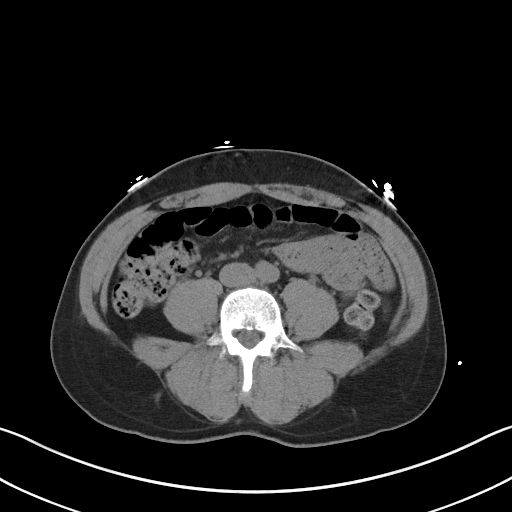
[im 53/92  soft-tissue]
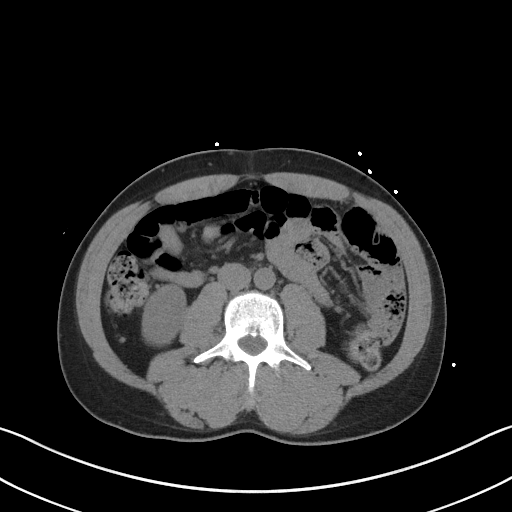
[im 60/92  soft-tissue]
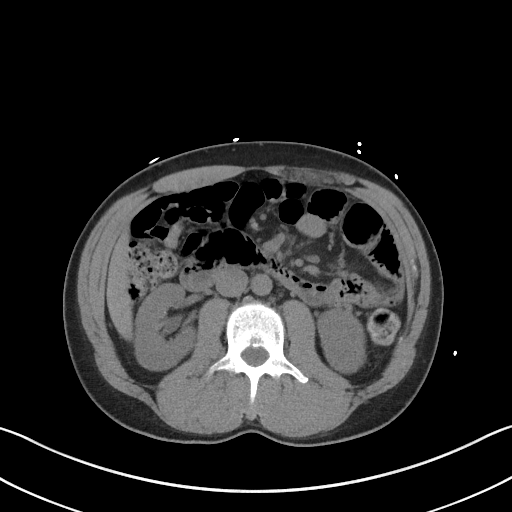
[im 60/92  bone]
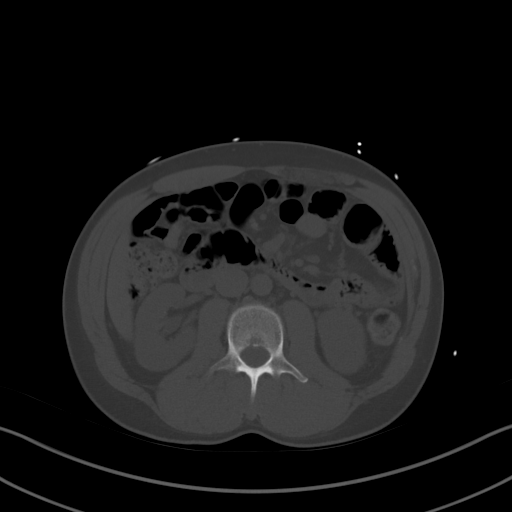
[im 67/92  soft-tissue]
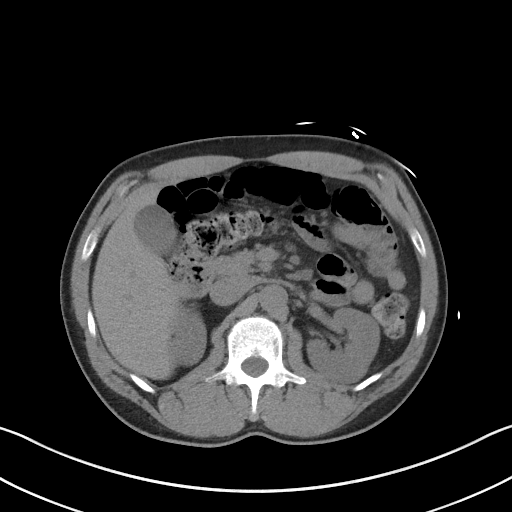
[im 74/92  soft-tissue]
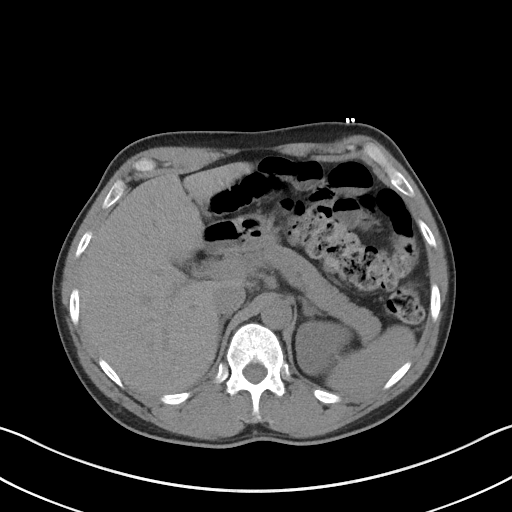
[im 81/92  soft-tissue]
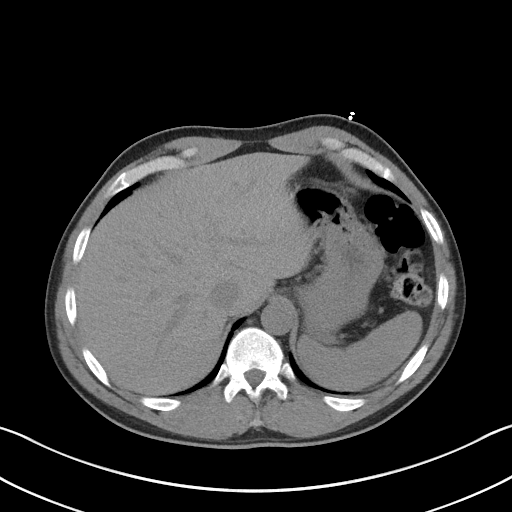
[im 88/92  soft-tissue]
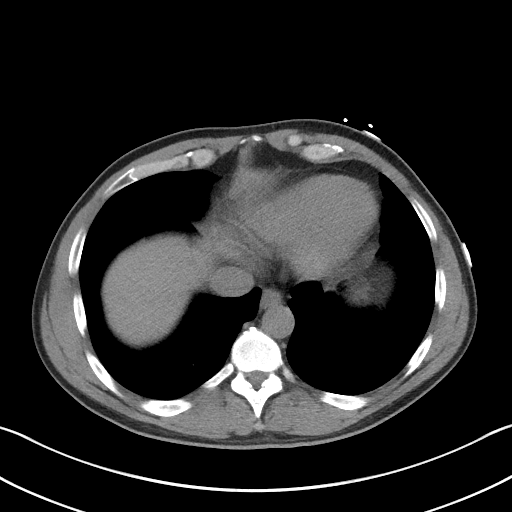

[Series 5: coronal · coronal · 0.72mm/px · 3 of 118 slices shown]
[im 40/118  soft-tissue]
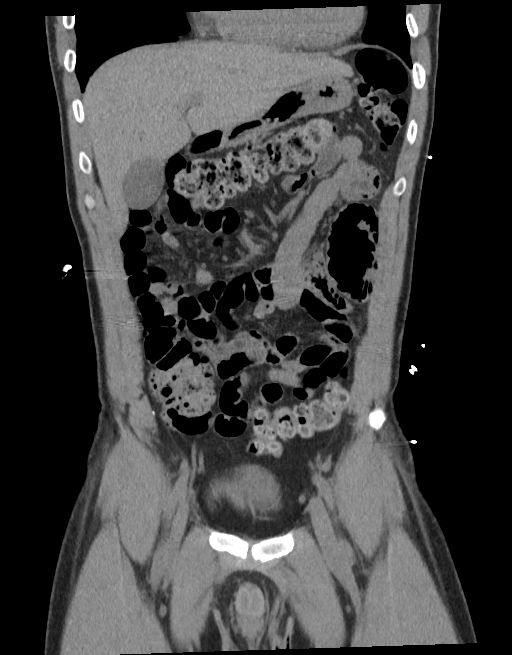
[im 53/118  soft-tissue]
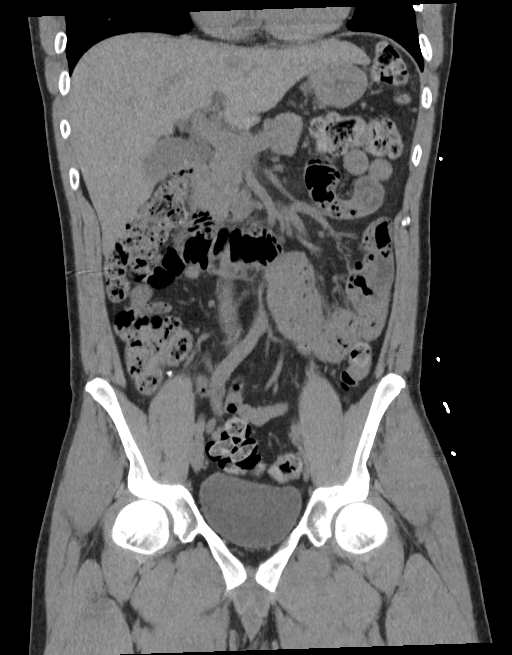
[im 66/118  soft-tissue]
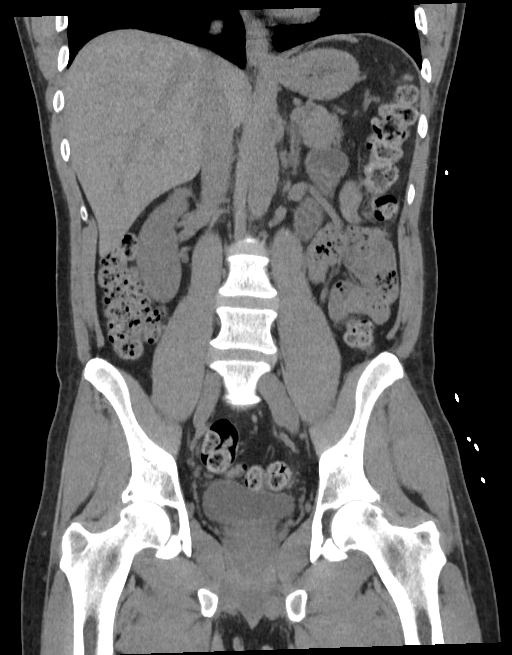

[16 of 46 positions shown; findings below may reference images not displayed]

FINDINGS: Lower chest: No acute pleural or parenchymal lung disease.

Hepatobiliary: No focal liver abnormality is seen. No gallstones,
gallbladder wall thickening, or biliary dilatation.

Pancreas: Unremarkable. No pancreatic ductal dilatation or
surrounding inflammatory changes.

Spleen: Normal in size without focal abnormality.

Adrenals/Urinary Tract: There are 2 punctate less than 1 mm
nonobstructing right renal calculi. Single punctate less than 1 mm
calculus lower pole left kidney. No obstructive uropathy within
either kidney. The adrenals and bladder are normal.

Stomach/Bowel: No bowel obstruction or ileus. Postsurgical changes
from appendectomy. No bowel wall thickening or inflammatory change.
Moderate retained stool throughout the colon.

Vascular/Lymphatic: No significant vascular findings are present. No
enlarged abdominal or pelvic lymph nodes. Numerous subcentimeter
lymph nodes are seen at the root of the mesentery, nonspecific but
stable since prior exam.

Reproductive: Prostate is unremarkable.

Other: No free fluid or free gas.  No abdominal wall hernia.

Musculoskeletal: No acute or destructive bony lesions. Reconstructed
images demonstrate no additional findings.
IMPRESSION: 1. Bilateral punctate nonobstructing renal calculi. No obstructive
uropathy within either kidney.
2. Moderate fecal retention.  No obstruction or ileus.

## 2024-01-08 HISTORY — PX: OTHER SURGICAL HISTORY: SHX169

## 2024-01-23 ENCOUNTER — Other Ambulatory Visit: Payer: Self-pay | Admitting: Orthopedic Surgery

## 2024-01-24 ENCOUNTER — Encounter (HOSPITAL_BASED_OUTPATIENT_CLINIC_OR_DEPARTMENT_OTHER): Payer: Self-pay | Admitting: Orthopedic Surgery

## 2024-01-24 ENCOUNTER — Other Ambulatory Visit: Payer: Self-pay

## 2024-01-29 ENCOUNTER — Encounter (HOSPITAL_BASED_OUTPATIENT_CLINIC_OR_DEPARTMENT_OTHER)
Admission: RE | Disposition: A | Discharge status: Home / Self Care | Payer: Self-pay | Source: Home / Self Care | Attending: Orthopedic Surgery

## 2024-01-29 ENCOUNTER — Ambulatory Visit (HOSPITAL_BASED_OUTPATIENT_CLINIC_OR_DEPARTMENT_OTHER): Admitting: Anesthesiology

## 2024-01-29 ENCOUNTER — Encounter (HOSPITAL_BASED_OUTPATIENT_CLINIC_OR_DEPARTMENT_OTHER): Payer: Self-pay | Admitting: Orthopedic Surgery

## 2024-01-29 ENCOUNTER — Other Ambulatory Visit: Payer: Self-pay

## 2024-01-29 ENCOUNTER — Ambulatory Visit (HOSPITAL_BASED_OUTPATIENT_CLINIC_OR_DEPARTMENT_OTHER)
Admission: RE | Admit: 2024-01-29 | Discharge: 2024-01-29 | Disposition: A | Discharge status: Home / Self Care | Attending: Orthopedic Surgery | Admitting: Orthopedic Surgery

## 2024-01-29 DIAGNOSIS — M65341 Trigger finger, right ring finger: Secondary | ICD-10-CM | POA: Insufficient documentation

## 2024-01-29 DIAGNOSIS — G5601 Carpal tunnel syndrome, right upper limb: Secondary | ICD-10-CM

## 2024-01-29 DIAGNOSIS — F419 Anxiety disorder, unspecified: Secondary | ICD-10-CM | POA: Diagnosis not present

## 2024-01-29 DIAGNOSIS — Z87891 Personal history of nicotine dependence: Secondary | ICD-10-CM | POA: Insufficient documentation

## 2024-01-29 DIAGNOSIS — F339 Major depressive disorder, recurrent, unspecified: Secondary | ICD-10-CM | POA: Diagnosis not present

## 2024-01-29 HISTORY — PX: CARPAL TUNNEL RELEASE: SHX101

## 2024-01-29 HISTORY — PX: TRIGGER FINGER RELEASE: SHX641

## 2024-01-29 SURGERY — CARPAL TUNNEL RELEASE
Anesthesia: General | Site: Wrist | Laterality: Right

## 2024-01-29 MED ORDER — LIDOCAINE 2% (20 MG/ML) 5 ML SYRINGE
INTRAMUSCULAR | Status: AC
  Filled 2024-01-29: qty 5

## 2024-01-29 MED ORDER — FENTANYL CITRATE (PF) 100 MCG/2ML IJ SOLN
INTRAMUSCULAR | Status: AC
  Filled 2024-01-29: qty 2

## 2024-01-29 MED ORDER — METOPROLOL TARTRATE 5 MG/5ML IV SOLN
INTRAVENOUS | Status: DC | PRN
  Administered 2024-01-29: 5 mg via INTRAVENOUS

## 2024-01-29 MED ORDER — ACETAMINOPHEN 10 MG/ML IV SOLN
INTRAVENOUS | Status: AC
Start: 2024-01-29 — End: 2024-01-29
  Filled 2024-01-29: qty 100

## 2024-01-29 MED ORDER — MIDAZOLAM HCL 5 MG/5ML IJ SOLN
INTRAMUSCULAR | Status: DC | PRN
  Administered 2024-01-29: 2 mg via INTRAVENOUS

## 2024-01-29 MED ORDER — HYDROMORPHONE HCL 1 MG/ML IJ SOLN
INTRAMUSCULAR | Status: AC
  Filled 2024-01-29: qty 0.5

## 2024-01-29 MED ORDER — CEFAZOLIN SODIUM-DEXTROSE 2-3 GM-%(50ML) IV SOLR
INTRAVENOUS | Status: DC | PRN
Start: 2024-01-29 — End: 2024-01-29
  Administered 2024-01-29: 2 g via INTRAVENOUS

## 2024-01-29 MED ORDER — OXYCODONE HCL 5 MG PO TABS: 5.0000 mg | ORAL_TABLET | Freq: Once | ORAL | Status: DC | PRN

## 2024-01-29 MED ORDER — LIDOCAINE 2% (20 MG/ML) 5 ML SYRINGE
INTRAMUSCULAR | Status: DC | PRN
  Administered 2024-01-29: 60 mg via INTRAVENOUS

## 2024-01-29 MED ORDER — DEXMEDETOMIDINE HCL IN NACL 80 MCG/20ML IV SOLN
INTRAVENOUS | Status: DC | PRN
  Administered 2024-01-29: 12 ug via INTRAVENOUS
  Administered 2024-01-29: 8 ug via INTRAVENOUS

## 2024-01-29 MED ORDER — MIDAZOLAM HCL 2 MG/2ML IJ SOLN
INTRAMUSCULAR | Status: AC
  Filled 2024-01-29: qty 2

## 2024-01-29 MED ORDER — OXYCODONE HCL 5 MG/5ML PO SOLN: 5.0000 mg | Freq: Once | ORAL | Status: DC | PRN

## 2024-01-29 MED ORDER — DEXAMETHASONE SODIUM PHOSPHATE 10 MG/ML IJ SOLN
INTRAMUSCULAR | Status: DC | PRN
  Administered 2024-01-29: 8 mg via INTRAVENOUS

## 2024-01-29 MED ORDER — ONDANSETRON HCL 4 MG/2ML IJ SOLN
INTRAMUSCULAR | Status: DC | PRN
  Administered 2024-01-29: 4 mg via INTRAVENOUS

## 2024-01-29 MED ORDER — BUPIVACAINE HCL (PF) 0.25 % IJ SOLN
INTRAMUSCULAR | Status: DC | PRN
  Administered 2024-01-29: 9 mL

## 2024-01-29 MED ORDER — ONDANSETRON HCL 4 MG/2ML IJ SOLN: 4.0000 mg | Freq: Four times a day (QID) | INTRAMUSCULAR | Status: DC | PRN

## 2024-01-29 MED ORDER — PROPOFOL 10 MG/ML IV BOLUS
INTRAVENOUS | Status: DC | PRN
Start: 2024-01-29 — End: 2024-01-29
  Administered 2024-01-29: 200 mg via INTRAVENOUS
  Administered 2024-01-29: 50 mg via INTRAVENOUS

## 2024-01-29 MED ORDER — ONDANSETRON HCL 4 MG/2ML IJ SOLN
INTRAMUSCULAR | Status: AC
  Filled 2024-01-29: qty 2

## 2024-01-29 MED ORDER — 0.9 % SODIUM CHLORIDE (POUR BTL) OPTIME
TOPICAL | Status: DC | PRN
  Administered 2024-01-29: 120 mL

## 2024-01-29 MED ORDER — DEXAMETHASONE SODIUM PHOSPHATE 10 MG/ML IJ SOLN
INTRAMUSCULAR | Status: AC
  Filled 2024-01-29: qty 1

## 2024-01-29 MED ORDER — ACETAMINOPHEN 10 MG/ML IV SOLN
INTRAVENOUS | Status: DC | PRN
  Administered 2024-01-29: 1000 mg via INTRAVENOUS

## 2024-01-29 MED ORDER — FENTANYL CITRATE (PF) 100 MCG/2ML IJ SOLN
25.0000 ug | INTRAMUSCULAR | Status: DC | PRN
  Administered 2024-01-29 (×2): 50 ug via INTRAVENOUS

## 2024-01-29 MED ORDER — HYDROCODONE-ACETAMINOPHEN 5-325 MG PO TABS: 1.0000 | ORAL_TABLET | Freq: Four times a day (QID) | ORAL | 0 refills | Status: DC | PRN

## 2024-01-29 MED ORDER — FENTANYL CITRATE (PF) 100 MCG/2ML IJ SOLN
INTRAMUSCULAR | Status: DC | PRN
  Administered 2024-01-29 (×2): 50 ug via INTRAVENOUS

## 2024-01-29 MED ORDER — CEFAZOLIN SODIUM-DEXTROSE 2-4 GM/100ML-% IV SOLN
INTRAVENOUS | Status: AC
Start: 2024-01-29 — End: 2024-01-29
  Filled 2024-01-29: qty 100

## 2024-01-29 MED ORDER — LACTATED RINGERS IV SOLN: INTRAVENOUS | Status: DC

## 2024-01-29 MED ORDER — PROPOFOL 500 MG/50ML IV EMUL
INTRAVENOUS | Status: DC | PRN
  Administered 2024-01-29: 125 ug/kg/min via INTRAVENOUS

## 2024-01-29 SURGICAL SUPPLY — 30 items
BLADE SURG 15 STRL LF DISP TIS (BLADE) ×4 IMPLANT
BNDG COHESIVE 2X5 TAN ST LF (GAUZE/BANDAGES/DRESSINGS) ×2 IMPLANT
BNDG COMPR ESMARK 4X3 LF (GAUZE/BANDAGES/DRESSINGS) IMPLANT
BNDG ELASTIC 3INX 5YD STR LF (GAUZE/BANDAGES/DRESSINGS) ×2 IMPLANT
BNDG GAUZE DERMACEA FLUFF 4 (GAUZE/BANDAGES/DRESSINGS) ×2 IMPLANT
CHLORAPREP W/TINT 26 (MISCELLANEOUS) ×2 IMPLANT
CORD BIPOLAR FORCEPS 12FT (ELECTRODE) ×2 IMPLANT
COVER BACK TABLE 60X90IN (DRAPES) ×2 IMPLANT
COVER MAYO STAND STRL (DRAPES) ×2 IMPLANT
CUFF TOURN SGL QUICK 18X4 (TOURNIQUET CUFF) ×2 IMPLANT
DRAPE EXTREMITY T 121X128X90 (DISPOSABLE) ×2 IMPLANT
DRAPE SURG 17X23 STRL (DRAPES) ×2 IMPLANT
GAUZE PAD ABD 8X10 STRL (GAUZE/BANDAGES/DRESSINGS) ×2 IMPLANT
GAUZE SPONGE 4X4 12PLY STRL (GAUZE/BANDAGES/DRESSINGS) ×2 IMPLANT
GAUZE XEROFORM 1X8 LF (GAUZE/BANDAGES/DRESSINGS) ×2 IMPLANT
GLOVE BIO SURGEON STRL SZ7.5 (GLOVE) ×2 IMPLANT
GLOVE BIOGEL PI IND STRL 8 (GLOVE) ×2 IMPLANT
GOWN STRL REUS W/ TWL LRG LVL3 (GOWN DISPOSABLE) ×2 IMPLANT
GOWN STRL REUS W/TWL XL LVL3 (GOWN DISPOSABLE) ×2 IMPLANT
NDL HYPO 25X1 1.5 SAFETY (NEEDLE) ×2 IMPLANT
NEEDLE HYPO 25X1 1.5 SAFETY (NEEDLE) ×2 IMPLANT
NS IRRIG 1000ML POUR BTL (IV SOLUTION) ×2 IMPLANT
PACK BASIN DAY SURGERY FS (CUSTOM PROCEDURE TRAY) ×2 IMPLANT
PADDING CAST ABS COTTON 4X4 ST (CAST SUPPLIES) ×2 IMPLANT
STOCKINETTE 4X48 STRL (DRAPES) ×2 IMPLANT
SUT ETHILON 4 0 PS 2 18 (SUTURE) ×2 IMPLANT
SYR BULB EAR ULCER 3OZ GRN STR (SYRINGE) ×2 IMPLANT
SYR CONTROL 10ML LL (SYRINGE) ×2 IMPLANT
TOWEL GREEN STERILE FF (TOWEL DISPOSABLE) ×4 IMPLANT
UNDERPAD 30X36 HEAVY ABSORB (UNDERPADS AND DIAPERS) ×2 IMPLANT

## 2024-01-29 NOTE — Discharge Instructions (Addendum)
 Follow up with a primary care physician as soon as possible regarding elevated BP. Go to [email or web address] and click on Find a doctor to get assistance with getting connected with a PCP.  No tylenol  before 9:45p. No hydrocodone  before 11pm.    [facility] Instructions Hand Surgery  Wound Care: Keep your hand elevated above the level of your heart.  Do not allow it to dangle by your side.  Keep the dressing dry and do not remove it unless your doctor advises you to do so.  He will usually change it at the time of your post-op visit.  Moving your fingers is advised to stimulate circulation but will depend on the site of your surgery.  If you have a splint applied, your doctor will advise you regarding movement.  Activity: Do not drive or operate machinery today.  Rest today and then you may return to your normal activity and work as indicated by your physician.  Diet:  Drink liquids today or eat a light diet.  You may resume a regular diet tomorrow.    General expectations: Pain for two to three days. Fingers may become slightly swollen.  Call your doctor if any of the following occur: Severe pain not relieved by pain medication. Elevated temperature. Dressing soaked with blood. Inability to move fingers. White or bluish color to fingers.    Post Anesthesia Home Care Instructions  Activity: Get plenty of rest for the remainder of the day. A responsible individual must stay with you for 24 hours following the procedure.  For the next 24 hours, DO NOT: -Drive a car -Advertising copywriter -Drink alcoholic beverages -Take any medication unless instructed by your physician -Make any legal decisions or sign important papers.  Meals: Start with liquid foods such as gelatin or soup. Progress to regular foods as tolerated. Avoid greasy, spicy, heavy foods. If nausea and/or vomiting occur, drink only clear liquids until the nausea and/or vomiting subsides. Call your physician if vomiting  continues.  Special Instructions/Symptoms: Your throat may feel dry or sore from the anesthesia or the breathing tube placed in your throat during surgery. If this causes discomfort, gargle with warm salt water. The discomfort should disappear within 24 hours.  If you had a scopolamine patch placed behind your ear for the management of post- operative nausea and/or vomiting:  1. The medication in the patch is effective for 72 hours, after which it should be removed.  Wrap patch in a tissue and discard in the trash. Wash hands thoroughly with soap and water. 2. You may remove the patch earlier than 72 hours if you experience unpleasant side effects which may include dry mouth, dizziness or visual disturbances. 3. Avoid touching the patch. Wash your hands with soap and water after contact with the patch.

## 2024-01-29 NOTE — Anesthesia Procedure Notes (Signed)
 Procedure Name: LMA Insertion Date/Time: 01/29/2024 3:21 PM  Performed by: Denton Niels CROME, CRNAPre-anesthesia Checklist: Patient identified, Emergency Drugs available, Suction available, Patient being monitored and Timeout performed Patient Re-evaluated:Patient Re-evaluated prior to induction Oxygen Delivery Method: Circle system utilized Preoxygenation: Pre-oxygenation with 100% oxygen Induction Type: IV induction Ventilation: Mask ventilation without difficulty LMA: LMA with gastric port inserted LMA Size: 4.0 Number of attempts: 1 Placement Confirmation: positive ETCO2 Tube secured with: Tape Dental Injury: Teeth and Oropharynx as per pre-operative assessment

## 2024-01-29 NOTE — Anesthesia Postprocedure Evaluation (Signed)
 Anesthesia Post Note  Patient: Ray Rosales  Procedure(s) Performed: CARPAL TUNNEL RELEASE (Right: Wrist) RELEASE, A1 PULLEY, FOR TRIGGER FINGER (Right: Ring Finger)     Patient location during evaluation: PACU Anesthesia Type: General Level of consciousness: awake and alert, oriented and patient cooperative Pain management: pain level controlled Vital Signs Assessment: post-procedure vital signs reviewed and stable Respiratory status: spontaneous breathing, nonlabored ventilation and respiratory function stable Cardiovascular status: blood pressure returned to baseline and stable Postop Assessment: no apparent nausea or vomiting Anesthetic complications: no   No notable events documented.  Last Vitals:  Vitals:   01/29/24 1617 01/29/24 1618  BP:    Pulse: 80 80  Resp: 18 18  Temp:    SpO2: 100% 100%    Last Pain:  Vitals:   01/29/24 1604  TempSrc:   PainSc: 0-No pain                 Almarie CHRISTELLA Marchi
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
## 2024-01-29 NOTE — Op Note (Signed)
 01/29/2024 [facility]                              OPERATIVE REPORT   PREOPERATIVE DIAGNOSIS:   1.  Right carpal tunnel syndrome 2.  Right ring finger trigger digit  POSTOPERATIVE DIAGNOSIS:   1.  Right carpal tunnel syndrome 2.  Right ring finger trigger digit  PROCEDURE:   1.  Right carpal tunnel release 2.  Right ring finger trigger release  SURGEON:  Elwanda Moger, MD  ASSISTANT:  none.  ANESTHESIA: General  IV FLUIDS:  Per anesthesia flow sheet  ESTIMATED BLOOD LOSS:  Minimal  COMPLICATIONS:  None  SPECIMENS:  None  TOURNIQUET TIME:    Total Tourniquet Time Documented: Upper Arm (Right) - 16 minutes Total: Upper Arm (Right) - 16 minutes   DISPOSITION:  Stable to PACU  LOCATION: [facility]  INDICATIONS:  48 y.o. yo male with numbness and tingling right hand.  Nocturnal symptoms. Positive nerve conduction studies.he also has triggering of the right ring finger.  He wishes to proceed with right carpal tunnel release and right ring finger trigger release.  Risks, benefits and alternatives of surgery were discussed including the risk of blood loss; infection; damage to nerves, vessels, tendons, ligaments, bone; failure of surgery; need for additional surgery; complications with wound healing; continued pain; recurrence of carpal tunnel syndrome; and damage to motor branch. He voiced understanding of these risks and elected to proceed.   OPERATIVE COURSE:  After being identified preoperatively by myself, the patient and I agreed upon the procedure and site of procedure.  The surgical site was marked.  Surgical consent had been signed.  He was given IV Ancef  as preoperative antibiotic prophylaxis.  He was transferred to the operating room and placed on the operating room table in supine position with the right upper extremity on an armboard.  General anesthesia was induced by the anesthesiologist.  Right upper extremity was prepped and draped in  normal sterile orthopaedic fashion.  A surgical pause was performed between the surgeons, anesthesia, and operating room staff, and all were in agreement as to the patient, procedure, and site of procedure.  Tourniquet at the proximal aspect of the extremity was inflated to 250 mmHg after exsanguination of the arm with an Esmarch bandage  Incision was made over the transverse carpal ligament and carried into the subcutaneous tissues by spreading technique.  Bipolar electrocautery was used to obtain hemostasis.  The palmar fascia was sharply incised.  The transverse carpal ligament was identified.  The fascia distal to the ligament was opened.  Retractor was placed and the flexor tendons were identified.  The flexor tendon to the ring finger was identified and retracted radially.  The transverse carpal ligament was then incised from distal to proximal under direct visualization.  Scissors were used to split the distal aspect of the volar antebrachial fascia.  A finger was placed into the wound to ensure complete decompression, which was the case.  The nerve was examined.  There was an hourglass deformity. It was adherent to the radial leaflet.  The motor branch was identified and was intact.  The wound was copiously irrigated with sterile saline.  It was then closed with 4-0 nylon in a horizontal mattress fashion.  An incision was made at the volar aspect of the MP joint of the ring finger.  This was carried into the subcutaneous tissues by spreading technique.  Bipolar electrocautery was used to obtain hemostasis.  The radial and ulnar digital nerves were protected throughout the case. The flexor sheath was identified.  The A1 pulley was identified and sharply incised.  It was released in its entirety.  The proximal 1-2 mm of the A2 pulley was vented to allow better excursion of the tendons.  The finger was placed through a range of motion and there was noted to be no catching.  The tendons were brought through the  wound and any adherences released.  The wound was then copiously irrigated with sterile saline. It was closed with 4-0 nylon in a horizontal mattress fashion.  It was injected with 0.25% plain Marcaine  to aid in postoperative analgesia.  It was dressed with sterile Xeroform, 4x4s, an ABD, and wrapped with Kerlix and an Ace bandage.  Tourniquet was deflated at 16 minutes.  Fingertips were pink with brisk capillary refill after deflation of the tourniquet.  Operative drapes were broken down.  The patient was awoken from anesthesia safely.  He was transferred back to stretcher and taken to the PACU in stable condition.  I will see him back in the office in 1 week for postoperative followup.  I will give him a prescription for Norco 5/325 1 tab PO q6 hours prn pain, dispense #15.    Basem Yannuzzi, MD Electronically signed, 01/29/24

## 2024-01-29 NOTE — Transfer of Care (Signed)
 Immediate Anesthesia Transfer of Care Note  Patient: Ray Rosales  Procedure(s) Performed: CARPAL TUNNEL RELEASE (Right: Wrist) RELEASE, A1 PULLEY, FOR TRIGGER FINGER (Right: Ring Finger)  Patient Location: PACU  Anesthesia Type:General  Level of Consciousness: drowsy and patient cooperative  Airway & Oxygen Therapy: Patient Spontanous Breathing and Patient connected to face mask oxygen  Post-op Assessment: Report given to RN and Post -op Vital signs reviewed and stable  Post vital signs: Reviewed and stable  Last Vitals:  Vitals Value Taken Time  BP 140/103 01/29/24 16:04  Temp    Pulse 78 01/29/24 16:09  Resp 19 01/29/24 16:09  SpO2 100 % 01/29/24 16:09  Vitals shown include unfiled device data.  Last Pain:  Vitals:   01/29/24 1259  TempSrc: Temporal  PainSc: 6       Patients Stated Pain Goal: 5 (01/29/24 1259)  Complications: No notable events documented.

## 2024-01-29 NOTE — Anesthesia Preprocedure Evaluation (Signed)
 Anesthesia Evaluation  Patient identified by MRN, date of birth, ID band Patient awake    Reviewed: Allergy & Precautions, H&P , NPO status , Patient's Chart, lab work & pertinent test results  Airway Mallampati: II   Neck ROM: full    Dental   Pulmonary former smoker   breath sounds clear to auscultation       Cardiovascular negative cardio ROS  Rhythm:regular Rate:Normal     Neuro/Psych  Headaches PSYCHIATRIC DISORDERS Anxiety Depression       GI/Hepatic   Endo/Other    Renal/GU stones     Musculoskeletal   Abdominal   Peds  Hematology   Anesthesia Other Findings   Reproductive/Obstetrics                              Anesthesia Physical Anesthesia Plan  ASA: 2  Anesthesia Plan: General   Post-op Pain Management:    Induction: Intravenous  PONV Risk Score and Plan: 2 and Ondansetron , Dexamethasone , Midazolam  and Treatment may vary due to age or medical condition  Airway Management Planned: LMA  Additional Equipment:   Intra-op Plan:   Post-operative Plan: Extubation in OR  Informed Consent: I have reviewed the patients History and Physical, chart, labs and discussed the procedure including the risks, benefits and alternatives for the proposed anesthesia with the patient or authorized representative who has indicated his/her understanding and acceptance.     Dental advisory given  Plan Discussed with: CRNA, Anesthesiologist and Surgeon  Anesthesia Plan Comments:         Anesthesia Quick Evaluation

## 2024-01-30 ENCOUNTER — Encounter (HOSPITAL_BASED_OUTPATIENT_CLINIC_OR_DEPARTMENT_OTHER): Payer: Self-pay | Admitting: Orthopedic Surgery

## 2024-02-08 ENCOUNTER — Other Ambulatory Visit: Payer: Self-pay | Admitting: Orthopedic Surgery

## 2024-03-06 ENCOUNTER — Encounter (HOSPITAL_BASED_OUTPATIENT_CLINIC_OR_DEPARTMENT_OTHER): Payer: Self-pay | Admitting: Orthopedic Surgery

## 2024-03-06 ENCOUNTER — Other Ambulatory Visit: Payer: Self-pay

## 2024-03-13 ENCOUNTER — Ambulatory Visit (HOSPITAL_BASED_OUTPATIENT_CLINIC_OR_DEPARTMENT_OTHER)
Admission: RE | Admit: 2024-03-13 | Discharge: 2024-03-13 | Disposition: A | Discharge status: Home / Self Care | Attending: Orthopedic Surgery | Admitting: Orthopedic Surgery

## 2024-03-13 ENCOUNTER — Ambulatory Visit (HOSPITAL_BASED_OUTPATIENT_CLINIC_OR_DEPARTMENT_OTHER): Admitting: Anesthesiology

## 2024-03-13 ENCOUNTER — Other Ambulatory Visit: Payer: Self-pay

## 2024-03-13 ENCOUNTER — Encounter (HOSPITAL_BASED_OUTPATIENT_CLINIC_OR_DEPARTMENT_OTHER)
Admission: RE | Disposition: A | Discharge status: Home / Self Care | Payer: Self-pay | Source: Home / Self Care | Attending: Orthopedic Surgery

## 2024-03-13 ENCOUNTER — Encounter (HOSPITAL_BASED_OUTPATIENT_CLINIC_OR_DEPARTMENT_OTHER): Payer: Self-pay | Admitting: Orthopedic Surgery

## 2024-03-13 DIAGNOSIS — M65352 Trigger finger, left little finger: Secondary | ICD-10-CM | POA: Insufficient documentation

## 2024-03-13 DIAGNOSIS — M65332 Trigger finger, left middle finger: Secondary | ICD-10-CM | POA: Insufficient documentation

## 2024-03-13 DIAGNOSIS — Z87891 Personal history of nicotine dependence: Secondary | ICD-10-CM | POA: Diagnosis not present

## 2024-03-13 DIAGNOSIS — G5602 Carpal tunnel syndrome, left upper limb: Secondary | ICD-10-CM

## 2024-03-13 DIAGNOSIS — Z79899 Other long term (current) drug therapy: Secondary | ICD-10-CM | POA: Insufficient documentation

## 2024-03-13 DIAGNOSIS — K219 Gastro-esophageal reflux disease without esophagitis: Secondary | ICD-10-CM | POA: Diagnosis not present

## 2024-03-13 HISTORY — PX: TRIGGER FINGER RELEASE: SHX641

## 2024-03-13 HISTORY — PX: CARPAL TUNNEL RELEASE: SHX101

## 2024-03-13 SURGERY — CARPAL TUNNEL RELEASE
Anesthesia: General | Site: Wrist | Laterality: Left

## 2024-03-13 MED ORDER — ACETAMINOPHEN 500 MG PO TABS: 1000.0000 mg | ORAL_TABLET | Freq: Once | ORAL | Status: DC

## 2024-03-13 MED ORDER — ONDANSETRON HCL 4 MG/2ML IJ SOLN
INTRAMUSCULAR | Status: DC | PRN
  Administered 2024-03-13: 4 mg via INTRAVENOUS

## 2024-03-13 MED ORDER — ACETAMINOPHEN 10 MG/ML IV SOLN: 1000.0000 mg | Freq: Once | INTRAVENOUS | Status: DC | PRN

## 2024-03-13 MED ORDER — 0.9 % SODIUM CHLORIDE (POUR BTL) OPTIME
TOPICAL | Status: DC | PRN
  Administered 2024-03-13: 1000 mL

## 2024-03-13 MED ORDER — OXYCODONE HCL 5 MG PO TABS: 5.0000 mg | ORAL_TABLET | Freq: Once | ORAL | Status: DC | PRN

## 2024-03-13 MED ORDER — FENTANYL CITRATE (PF) 100 MCG/2ML IJ SOLN
25.0000 ug | INTRAMUSCULAR | Status: DC | PRN
  Administered 2024-03-13: 50 ug via INTRAVENOUS
  Administered 2024-03-13 (×2): 25 ug via INTRAVENOUS

## 2024-03-13 MED ORDER — HYDROCODONE-ACETAMINOPHEN 5-325 MG PO TABS: 1.0000 | ORAL_TABLET | Freq: Four times a day (QID) | ORAL | 0 refills | Status: DC | PRN

## 2024-03-13 MED ORDER — PROPOFOL 10 MG/ML IV BOLUS
INTRAVENOUS | Status: DC | PRN
  Administered 2024-03-13: 200 mg via INTRAVENOUS
  Administered 2024-03-13 (×3): 50 mg via INTRAVENOUS

## 2024-03-13 MED ORDER — ACETAMINOPHEN 10 MG/ML IV SOLN
INTRAVENOUS | Status: DC | PRN
  Administered 2024-03-13: 1000 mg via INTRAVENOUS

## 2024-03-13 MED ORDER — DEXMEDETOMIDINE HCL IN NACL 80 MCG/20ML IV SOLN
INTRAVENOUS | Status: DC | PRN
  Administered 2024-03-13 (×5): 4 ug via INTRAVENOUS

## 2024-03-13 MED ORDER — OXYCODONE HCL 5 MG/5ML PO SOLN: 5.0000 mg | Freq: Once | ORAL | Status: DC | PRN

## 2024-03-13 MED ORDER — CEFAZOLIN SODIUM-DEXTROSE 2-3 GM-%(50ML) IV SOLR
INTRAVENOUS | Status: DC | PRN
  Administered 2024-03-13: 2 g via INTRAVENOUS

## 2024-03-13 MED ORDER — MIDAZOLAM HCL (PF) 2 MG/2ML IJ SOLN
INTRAMUSCULAR | Status: DC | PRN
  Administered 2024-03-13: 2 mg via INTRAVENOUS

## 2024-03-13 MED ORDER — FENTANYL CITRATE (PF) 100 MCG/2ML IJ SOLN
INTRAMUSCULAR | Status: AC
  Filled 2024-03-13: qty 2

## 2024-03-13 MED ORDER — BUPIVACAINE HCL (PF) 0.25 % IJ SOLN
INTRAMUSCULAR | Status: DC | PRN
Start: 2024-03-13 — End: 2024-03-13
  Administered 2024-03-13: 9 mL

## 2024-03-13 MED ORDER — DROPERIDOL 2.5 MG/ML IJ SOLN
0.6250 mg | Freq: Once | INTRAMUSCULAR | Status: DC | PRN
Start: 2024-03-13 — End: 2024-03-13

## 2024-03-13 MED ORDER — DEXAMETHASONE SOD PHOSPHATE PF 10 MG/ML IJ SOLN
INTRAMUSCULAR | Status: DC | PRN
  Administered 2024-03-13: 5 mg via INTRAVENOUS

## 2024-03-13 MED ORDER — LIDOCAINE 2% (20 MG/ML) 5 ML SYRINGE
INTRAMUSCULAR | Status: DC | PRN
  Administered 2024-03-13: 40 mg via INTRAVENOUS

## 2024-03-13 MED ORDER — LACTATED RINGERS IV SOLN: INTRAVENOUS | Status: DC

## 2024-03-13 MED ORDER — MIDAZOLAM HCL 2 MG/2ML IJ SOLN
INTRAMUSCULAR | Status: AC
Start: 2024-03-13 — End: 2024-03-13
  Filled 2024-03-13: qty 2

## 2024-03-13 MED ORDER — FENTANYL CITRATE (PF) 250 MCG/5ML IJ SOLN
INTRAMUSCULAR | Status: DC | PRN
  Administered 2024-03-13 (×2): 50 ug via INTRAVENOUS

## 2024-03-13 SURGICAL SUPPLY — 31 items
BLADE SURG 15 STRL LF DISP TIS (BLADE) ×4 IMPLANT
BNDG COHESIVE 2X5 TAN ST LF (GAUZE/BANDAGES/DRESSINGS) ×2 IMPLANT
BNDG COMPR ESMARK 4X3 LF (GAUZE/BANDAGES/DRESSINGS) IMPLANT
BNDG ELASTIC 3INX 5YD STR LF (GAUZE/BANDAGES/DRESSINGS) ×2 IMPLANT
BNDG GAUZE DERMACEA FLUFF 4 (GAUZE/BANDAGES/DRESSINGS) ×2 IMPLANT
CHLORAPREP W/TINT 26 (MISCELLANEOUS) ×2 IMPLANT
CORD BIPOLAR FORCEPS 12FT (ELECTRODE) ×2 IMPLANT
COVER BACK TABLE 60X90IN (DRAPES) ×2 IMPLANT
COVER MAYO STAND STRL (DRAPES) ×2 IMPLANT
CUFF TOURN SGL QUICK 18X4 (TOURNIQUET CUFF) ×2 IMPLANT
DRAPE EXTREMITY T 121X128X90 (DISPOSABLE) ×2 IMPLANT
DRAPE SURG 17X23 STRL (DRAPES) ×2 IMPLANT
GAUZE PAD ABD 8X10 STRL (GAUZE/BANDAGES/DRESSINGS) ×2 IMPLANT
GAUZE SPONGE 4X4 12PLY STRL (GAUZE/BANDAGES/DRESSINGS) ×2 IMPLANT
GAUZE XEROFORM 1X8 LF (GAUZE/BANDAGES/DRESSINGS) ×2 IMPLANT
GLOVE BIO SURGEON STRL SZ7.5 (GLOVE) ×2 IMPLANT
GLOVE BIOGEL PI IND STRL 8 (GLOVE) ×2 IMPLANT
GOWN STRL REUS W/ TWL LRG LVL3 (GOWN DISPOSABLE) ×2 IMPLANT
GOWN STRL REUS W/TWL XL LVL3 (GOWN DISPOSABLE) ×2 IMPLANT
NDL HYPO 25X1 1.5 SAFETY (NEEDLE) ×2 IMPLANT
NEEDLE HYPO 25X1 1.5 SAFETY (NEEDLE) ×2 IMPLANT
PACK BASIN DAY SURGERY FS (CUSTOM PROCEDURE TRAY) ×2 IMPLANT
PADDING CAST ABS COTTON 4X4 ST (CAST SUPPLIES) ×2 IMPLANT
SOLN 0.9% NACL POUR BTL 1000ML (IV SOLUTION) ×2 IMPLANT
STOCKINETTE 4X48 STRL (DRAPES) ×2 IMPLANT
SUT ETHILON 4 0 P 3 18 (SUTURE) IMPLANT
SUT ETHILON 4 0 PS 2 18 (SUTURE) ×2 IMPLANT
SYR BULB EAR ULCER 3OZ GRN STR (SYRINGE) ×2 IMPLANT
SYR CONTROL 10ML LL (SYRINGE) ×2 IMPLANT
TOWEL GREEN STERILE FF (TOWEL DISPOSABLE) ×4 IMPLANT
UNDERPAD 30X36 HEAVY ABSORB (UNDERPADS AND DIAPERS) ×2 IMPLANT

## 2024-03-13 NOTE — Op Note (Signed)
 03/13/2024 [facility]                              OPERATIVE REPORT   PREOPERATIVE DIAGNOSIS: 1.  Left carpal tunnel syndrome 2.  Left long finger trigger digit 3.  Left small finger trigger digit  POSTOPERATIVE DIAGNOSIS: 1.  Left carpal tunnel syndrome 2.  Left long finger trigger digit 3.  Left small finger trigger digit  PROCEDURE:   1.  Left carpal tunnel release 2.  Left long finger trigger release 3.  Left small finger trigger release  SURGEON:  Maritza Hosterman, MD  ASSISTANT:  none.  ANESTHESIA: General  IV FLUIDS:  Per anesthesia flow sheet  ESTIMATED BLOOD LOSS:  Minimal  COMPLICATIONS:  None  SPECIMENS:  None  TOURNIQUET TIME:    Total Tourniquet Time Documented: Upper Arm (Left) - 26 minutes Total: Upper Arm (Left) - 26 minutes   DISPOSITION:  Stable to PACU  LOCATION: [facility]  INDICATIONS:  48 y.o. yo male with numbness and tingling left hand and triggering of left long and small fingers.  Positive nerve conduction studies. He wishes to proceed with left carpal tunnel release and trigger release of left long and small fingers.  Risks, benefits and alternatives of surgery were discussed including the risk of blood loss; infection; damage to nerves, vessels, tendons, ligaments, bone; failure of surgery; need for additional surgery; complications with wound healing; continued pain; recurrence of carpal tunnel syndrome; and damage to motor branch. He voiced understanding of these risks and elected to proceed.   OPERATIVE COURSE:  After being identified preoperatively by myself, the patient and I agreed upon the procedure and site of procedure.  The surgical site was marked.  Surgical consent had been signed.  He was given IV Ancef  as preoperative antibiotic prophylaxis.  He was transferred to the operating room and placed on the operating room table in supine position with the left upper extremity on an armboard.  General anesthesia  was induced by the anesthesiologist.  Left upper extremity was prepped and draped in normal sterile orthopaedic fashion.  A surgical pause was performed between the surgeons, anesthesia, and operating room staff, and all were in agreement as to the patient, procedure, and site of procedure.  Tourniquet at the proximal aspect of the extremity was inflated to 250 mmHg after exsanguination of the arm with an Esmarch bandage  An incision was made at the volar aspect of the MP joint of the long finger.  This was carried into the subcutaneous tissues by spreading technique.  Bipolar electrocautery was used to obtain hemostasis.  The radial and ulnar digital nerves were protected throughout the case. The flexor sheath was identified.  The A1 pulley was identified and sharply incised.  It was released in its entirety.  The proximal 1-2 mm of the A2 pulley was vented to allow better excursion of the tendons.  The finger was placed through a range of motion and there was noted to be no catching.  The tendons were brought through the wound and any adherences released.  The wound was then copiously irrigated with sterile saline. An incision was made at the volar aspect of the MP joint of the little finger.  This was carried into the subcutaneous tissues by spreading technique.  Bipolar electrocautery was used to obtain hemostasis.  The radial and ulnar digital nerves were protected throughout the case. The flexor sheath was  identified.  The A1 pulley was identified and sharply incised.  It was released in its entirety.  The proximal 1-2 mm of the A2 pulley was vented to allow better excursion of the tendons.  The finger was placed through a range of motion and there was noted to be no catching.  The tendons were brought through the wound and any adherences released.  The wounds were then copiously irrigated with sterile saline.  They were closed with 4-0 nylon in a horizontal mattress fashion.  Incision was then made over the  transverse carpal ligament and carried into the subcutaneous tissues by spreading technique.  Bipolar electrocautery was used to obtain hemostasis.  The palmar fascia was sharply incised.  The transverse carpal ligament was identified.  There was a branch of the ulnar branch of the median nerve crossing incision proximally.  The incision was extended proximally to aid in visualization and protection of this branch.  The fascia distal to the ligament was opened.  Retractor was placed and the flexor tendons were identified.  The flexor tendon to the ring finger was identified and retracted radially.  The transverse carpal ligament was then incised from distal to proximal under direct visualization.  Scissors were used to split the distal aspect of the volar antebrachial fascia.  A finger was placed into the wound to ensure complete decompression, which was the case.  The nerve was examined.  It was adherent to the radial leaflet.  The motor branch was identified and was intact.  The wound was copiously irrigated with sterile saline.  It was then closed with 4-0 nylon in a horizontal mattress fashion.  It was injected with 0.25% plain Marcaine  to aid in postoperative analgesia.  It was dressed with sterile Xeroform, 4x4s, an ABD, and wrapped with Kerlix and an Ace bandage.  Tourniquet was deflated at 26 minutes.  Fingertips were pink with brisk capillary refill after deflation of the tourniquet.  Operative drapes were broken down.  The patient was awoken from anesthesia safely.  He was transferred back to stretcher and taken to the PACU in stable condition.  I will see him back in the office in 1 week for postoperative followup.  I will give him a prescription for Norco 5/325 1 tab PO q6 hours prn pain, dispense #15.    Stephie Xu, MD Electronically signed, 03/13/24

## 2024-03-13 NOTE — Transfer of Care (Signed)
 Immediate Anesthesia Transfer of Care Note  Patient: Ray Rosales  Procedure(s) Performed: CARPAL TUNNEL RELEASE (Left: Wrist) RELEASE, A1 PULLEY, FOR TRIGGER FINGER (Left: Finger)  Patient Location: PACU  Anesthesia Type:General  Level of Consciousness: patient cooperative  Airway & Oxygen Therapy: Patient Spontanous Breathing and Patient connected to face mask oxygen  Post-op Assessment: Report given to RN and Post -op Vital signs reviewed and stable  Post vital signs: Reviewed and stable  Last Vitals:  Vitals Value Taken Time  BP 162/106 03/13/24 10:16  Temp 36.5 C 03/13/24 10:14  Pulse 90 03/13/24 10:19  Resp 19 03/13/24 10:19  SpO2 98 % 03/13/24 10:19  Vitals shown include unfiled device data.  Last Pain:  Vitals:   03/13/24 0738  TempSrc: Tympanic  PainSc: 0-No pain      Patients Stated Pain Goal: 6 (03/13/24 9261)  Complications: No notable events documented.

## 2024-03-13 NOTE — Discharge Instructions (Addendum)
 [facility] Instructions Hand Surgery  Wound Care: Keep your hand elevated above the level of your heart.  Do not allow it to dangle by your side.  Keep the dressing dry and do not remove it unless your doctor advises you to do so.  He will usually change it at the time of your post-op visit.  Moving your fingers is advised to stimulate circulation but will depend on the site of your surgery.  If you have a splint applied, your doctor will advise you regarding movement.  Activity: Do not drive or operate machinery today.  Rest today and then you may return to your normal activity and work as indicated by your physician.  Diet:  Drink liquids today or eat a light diet.  You may resume a regular diet tomorrow.    General expectations: Pain for two to three days. Fingers may become slightly swollen.  Call your doctor if any of the following occur: Severe pain not relieved by pain medication. Elevated temperature. Dressing soaked with blood. Inability to move fingers. White or bluish color to fingers.   No Tylenol  before 3:30pm today.   Post Anesthesia Home Care Instructions  Activity: Get plenty of rest for the remainder of the day. A responsible individual must stay with you for 24 hours following the procedure.  For the next 24 hours, DO NOT: -Drive a car -Advertising copywriter -Drink alcoholic beverages -Take any medication unless instructed by your physician -Make any legal decisions or sign important papers.  Meals: Start with liquid foods such as gelatin or soup. Progress to regular foods as tolerated. Avoid greasy, spicy, heavy foods. If nausea and/or vomiting occur, drink only clear liquids until the nausea and/or vomiting subsides. Call your physician if vomiting continues.  Special Instructions/Symptoms: Your throat may feel dry or sore from the anesthesia or the breathing tube placed in your throat during surgery. If this causes discomfort, gargle with warm salt water.  The discomfort should disappear within 24 hours.  If you had a scopolamine patch placed behind your ear for the management of post- operative nausea and/or vomiting:  1. The medication in the patch is effective for 72 hours, after which it should be removed.  Wrap patch in a tissue and discard in the trash. Wash hands thoroughly with soap and water. 2. You may remove the patch earlier than 72 hours if you experience unpleasant side effects which may include dry mouth, dizziness or visual disturbances. 3. Avoid touching the patch. Wash your hands with soap and water after contact with the patch.

## 2024-03-13 NOTE — Anesthesia Procedure Notes (Signed)
 Procedure Name: LMA Insertion Date/Time: 03/13/2024 9:27 AM  Performed by: Leotha Andrez DEL, CRNAPre-anesthesia Checklist: Patient identified, Emergency Drugs available, Suction available, Patient being monitored and Timeout performed Patient Re-evaluated:Patient Re-evaluated prior to induction Oxygen Delivery Method: Circle system utilized Preoxygenation: Pre-oxygenation with 100% oxygen Induction Type: IV induction Ventilation: Mask ventilation without difficulty LMA: LMA inserted LMA Size: 4.0 Tube size: 4.0 mm Number of attempts: 1 Placement Confirmation: breath sounds checked- equal and bilateral and positive ETCO2 Tube secured with: Tape Dental Injury: Teeth and Oropharynx as per pre-operative assessment
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
## 2024-03-13 NOTE — Anesthesia Postprocedure Evaluation (Signed)
 Anesthesia Post Note  Patient: Ray Rosales  Procedure(s) Performed: CARPAL TUNNEL RELEASE (Left: Wrist) RELEASE, A1 PULLEY, FOR TRIGGER FINGER (Left: Finger)     Patient location during evaluation: PACU Anesthesia Type: General Level of consciousness: awake and alert Pain management: pain level controlled Vital Signs Assessment: post-procedure vital signs reviewed and stable Respiratory status: spontaneous breathing, nonlabored ventilation, respiratory function stable and patient connected to nasal cannula oxygen Cardiovascular status: blood pressure returned to baseline and stable Postop Assessment: no apparent nausea or vomiting Anesthetic complications: no   No notable events documented.  Last Vitals:  Vitals:   03/13/24 1048 03/13/24 1105  BP: (!) 137/97 (!) 168/102  Pulse: 72 77  Resp: 14 20  Temp:  37.2 C  SpO2: 95% 96%    Last Pain:  Vitals:   03/13/24 1105  TempSrc: Temporal  PainSc: 0-No pain                 [name]

## 2024-03-13 NOTE — Anesthesia Preprocedure Evaluation (Addendum)
 Anesthesia Evaluation  Patient identified by MRN, date of birth, ID band Patient awake    Reviewed: Allergy & Precautions, NPO status , Patient's Chart, lab work & pertinent test results  Airway Mallampati: I  TM Distance: >3 FB Neck ROM: Full    Dental  (+) Edentulous Upper, Edentulous Lower   Pulmonary former smoker   breath sounds clear to auscultation       Cardiovascular negative cardio ROS  Rhythm:Regular Rate:Normal     Neuro/Psych  Headaches PSYCHIATRIC DISORDERS Anxiety Depression       GI/Hepatic Neg liver ROS,GERD  Medicated,,  Endo/Other  negative endocrine ROS    Renal/GU Renal disease     Musculoskeletal negative musculoskeletal ROS (+)    Abdominal   Peds  Hematology negative hematology ROS (+)   Anesthesia Other Findings   Reproductive/Obstetrics                              Anesthesia Physical Anesthesia Plan  ASA: 2  Anesthesia Plan: General   Post-op Pain Management: Tylenol  PO (pre-op)*   Induction: Intravenous  PONV Risk Score and Plan: 3 and Ondansetron , Dexamethasone  and Midazolam   Airway Management Planned: LMA  Additional Equipment: None  Intra-op Plan:   Post-operative Plan: Extubation in OR  Informed Consent: I have reviewed the patients History and Physical, chart, labs and discussed the procedure including the risks, benefits and alternatives for the proposed anesthesia with the patient or authorized representative who has indicated his/her understanding and acceptance.     Dental advisory given  Plan Discussed with: CRNA  Anesthesia Plan Comments:          Anesthesia Quick Evaluation

## 2024-03-14 ENCOUNTER — Encounter (HOSPITAL_BASED_OUTPATIENT_CLINIC_OR_DEPARTMENT_OTHER): Payer: Self-pay | Admitting: Orthopedic Surgery

## 2024-05-05 ENCOUNTER — Other Ambulatory Visit: Payer: Self-pay

## 2024-05-05 DIAGNOSIS — R2689 Other abnormalities of gait and mobility: Secondary | ICD-10-CM

## 2024-05-12 ENCOUNTER — Other Ambulatory Visit: Payer: Self-pay

## 2024-05-12 ENCOUNTER — Encounter (HOSPITAL_BASED_OUTPATIENT_CLINIC_OR_DEPARTMENT_OTHER): Payer: Self-pay | Admitting: Orthopedic Surgery

## 2024-05-14 ENCOUNTER — Ambulatory Visit: Admission: RE | Admit: 2024-05-14 | Source: Ambulatory Visit

## 2024-05-19 ENCOUNTER — Encounter (HOSPITAL_BASED_OUTPATIENT_CLINIC_OR_DEPARTMENT_OTHER): Payer: Self-pay | Admitting: Orthopedic Surgery

## 2024-05-19 ENCOUNTER — Ambulatory Visit (HOSPITAL_BASED_OUTPATIENT_CLINIC_OR_DEPARTMENT_OTHER): Admission: RE | Admit: 2024-05-19 | Source: Home / Self Care | Admitting: Orthopedic Surgery

## 2024-05-19 ENCOUNTER — Ambulatory Visit (HOSPITAL_BASED_OUTPATIENT_CLINIC_OR_DEPARTMENT_OTHER): Admitting: Anesthesiology

## 2024-05-19 ENCOUNTER — Encounter (HOSPITAL_BASED_OUTPATIENT_CLINIC_OR_DEPARTMENT_OTHER)
Admission: RE | Disposition: A | Discharge status: Home / Self Care | Payer: Self-pay | Source: Home / Self Care | Attending: Orthopedic Surgery

## 2024-05-19 ENCOUNTER — Other Ambulatory Visit: Payer: Self-pay

## 2024-05-19 DIAGNOSIS — Z87891 Personal history of nicotine dependence: Secondary | ICD-10-CM | POA: Diagnosis not present

## 2024-05-19 DIAGNOSIS — M65331 Trigger finger, right middle finger: Secondary | ICD-10-CM | POA: Diagnosis not present

## 2024-05-19 DIAGNOSIS — K219 Gastro-esophageal reflux disease without esophagitis: Secondary | ICD-10-CM | POA: Insufficient documentation

## 2024-05-19 HISTORY — PX: TRIGGER FINGER RELEASE: SHX641

## 2024-05-19 SURGERY — RELEASE, A1 PULLEY, FOR TRIGGER FINGER
Anesthesia: General | Site: Middle Finger | Laterality: Right

## 2024-05-19 MED ORDER — CEFAZOLIN SODIUM-DEXTROSE 2-3 GM-%(50ML) IV SOLR
INTRAVENOUS | Status: DC | PRN
  Administered 2024-05-19: 2 g via INTRAVENOUS

## 2024-05-19 MED ORDER — OXYCODONE HCL 5 MG PO TABS: 5.0000 mg | ORAL_TABLET | Freq: Once | ORAL | Status: DC | PRN

## 2024-05-19 MED ORDER — MIDAZOLAM HCL 2 MG/2ML IJ SOLN
INTRAMUSCULAR | Status: AC
  Filled 2024-05-19: qty 2

## 2024-05-19 MED ORDER — FENTANYL CITRATE (PF) 100 MCG/2ML IJ SOLN
INTRAMUSCULAR | Status: AC
  Filled 2024-05-19: qty 2

## 2024-05-19 MED ORDER — AMISULPRIDE (ANTIEMETIC) 5 MG/2ML IV SOLN: 10.0000 mg | Freq: Once | INTRAVENOUS | Status: DC | PRN

## 2024-05-19 MED ORDER — ACETAMINOPHEN 500 MG PO TABS
ORAL_TABLET | ORAL | Status: AC
  Filled 2024-05-19: qty 2

## 2024-05-19 MED ORDER — LACTATED RINGERS IV SOLN: INTRAVENOUS | Status: DC

## 2024-05-19 MED ORDER — BUPIVACAINE HCL (PF) 0.25 % IJ SOLN
INTRAMUSCULAR | Status: DC | PRN
  Administered 2024-05-19: 9 mL

## 2024-05-19 MED ORDER — 0.9 % SODIUM CHLORIDE (POUR BTL) OPTIME
TOPICAL | Status: DC | PRN
  Administered 2024-05-19: 75 mL

## 2024-05-19 MED ORDER — ONDANSETRON HCL 4 MG/2ML IJ SOLN
INTRAMUSCULAR | Status: DC | PRN
  Administered 2024-05-19: 4 mg via INTRAVENOUS

## 2024-05-19 MED ORDER — MIDAZOLAM HCL (PF) 2 MG/2ML IJ SOLN
INTRAMUSCULAR | Status: DC | PRN
  Administered 2024-05-19: 2 mg via INTRAVENOUS

## 2024-05-19 MED ORDER — OXYCODONE HCL 5 MG/5ML PO SOLN: 5.0000 mg | Freq: Once | ORAL | Status: DC | PRN

## 2024-05-19 MED ORDER — PROPOFOL 10 MG/ML IV BOLUS
INTRAVENOUS | Status: DC | PRN
  Administered 2024-05-19: 50 mg via INTRAVENOUS
  Administered 2024-05-19: 250 mg via INTRAVENOUS

## 2024-05-19 MED ORDER — HYDROCODONE-ACETAMINOPHEN 5-325 MG PO TABS: 1.0000 | ORAL_TABLET | Freq: Four times a day (QID) | ORAL | 0 refills | Status: AC | PRN

## 2024-05-19 MED ORDER — CEFAZOLIN SODIUM-DEXTROSE 2-4 GM/100ML-% IV SOLN
INTRAVENOUS | Status: AC
  Filled 2024-05-19: qty 100

## 2024-05-19 MED ORDER — DEXMEDETOMIDINE HCL IN NACL 80 MCG/20ML IV SOLN
INTRAVENOUS | Status: DC | PRN
  Administered 2024-05-19: 8 ug via INTRAVENOUS

## 2024-05-19 MED ORDER — FENTANYL CITRATE (PF) 100 MCG/2ML IJ SOLN: 25.0000 ug | INTRAMUSCULAR | Status: DC | PRN

## 2024-05-19 MED ORDER — DEXAMETHASONE SODIUM PHOSPHATE 4 MG/ML IJ SOLN
INTRAMUSCULAR | Status: DC | PRN
  Administered 2024-05-19: 5 mg via INTRAVENOUS

## 2024-05-19 MED ORDER — LIDOCAINE HCL (CARDIAC) PF 100 MG/5ML IV SOSY
PREFILLED_SYRINGE | INTRAVENOUS | Status: DC | PRN
  Administered 2024-05-19: 40 mg via INTRAVENOUS

## 2024-05-19 MED ORDER — FENTANYL CITRATE (PF) 100 MCG/2ML IJ SOLN
INTRAMUSCULAR | Status: DC | PRN
  Administered 2024-05-19 (×2): 50 ug via INTRAVENOUS

## 2024-05-19 MED ORDER — ACETAMINOPHEN 500 MG PO TABS
1000.0000 mg | ORAL_TABLET | Freq: Once | ORAL | Status: AC
  Administered 2024-05-19: 1000 mg via ORAL

## 2024-05-19 SURGICAL SUPPLY — 28 items
BLADE SURG 15 STRL LF DISP TIS (BLADE) ×2 IMPLANT
BNDG COHESIVE 2X5 TAN ST LF (GAUZE/BANDAGES/DRESSINGS) ×1 IMPLANT
BNDG COMPR ESMARK 4X3 LF (GAUZE/BANDAGES/DRESSINGS) IMPLANT
CHLORAPREP W/TINT 26 (MISCELLANEOUS) ×1 IMPLANT
CORD BIPOLAR FORCEPS 12FT (ELECTRODE) ×1 IMPLANT
COVER BACK TABLE 60X90IN (DRAPES) ×1 IMPLANT
COVER MAYO STAND STRL (DRAPES) ×1 IMPLANT
CUFF TOURN SGL QUICK 18X4 (TOURNIQUET CUFF) ×1 IMPLANT
DRAPE EXTREMITY T 121X128X90 (DISPOSABLE) ×1 IMPLANT
DRAPE SURG 17X23 STRL (DRAPES) ×1 IMPLANT
GAUZE SPONGE 4X4 12PLY STRL (GAUZE/BANDAGES/DRESSINGS) ×1 IMPLANT
GAUZE XEROFORM 1X8 LF (GAUZE/BANDAGES/DRESSINGS) ×1 IMPLANT
GLOVE BIO SURGEON STRL SZ7 (GLOVE) IMPLANT
GLOVE BIO SURGEON STRL SZ7.5 (GLOVE) ×1 IMPLANT
GLOVE BIOGEL PI IND STRL 7.0 (GLOVE) IMPLANT
GLOVE BIOGEL PI IND STRL 8 (GLOVE) ×1 IMPLANT
GLOVE SURG SS PI 7.0 STRL IVOR (GLOVE) IMPLANT
GOWN STRL REUS W/ TWL LRG LVL3 (GOWN DISPOSABLE) ×1 IMPLANT
GOWN STRL REUS W/TWL XL LVL3 (GOWN DISPOSABLE) ×1 IMPLANT
NEEDLE HYPO 25X1 1.5 SAFETY (NEEDLE) ×1 IMPLANT
PACK BASIN DAY SURGERY FS (CUSTOM PROCEDURE TRAY) ×1 IMPLANT
SOLN 0.9% NACL POUR BTL 1000ML (IV SOLUTION) ×1 IMPLANT
STOCKINETTE 4X48 STRL (DRAPES) ×1 IMPLANT
SUT ETHILON 4 0 PS 2 18 (SUTURE) ×1 IMPLANT
SYR BULB EAR ULCER 3OZ GRN STR (SYRINGE) ×1 IMPLANT
SYR CONTROL 10ML LL (SYRINGE) ×1 IMPLANT
TOWEL GREEN STERILE FF (TOWEL DISPOSABLE) ×2 IMPLANT
UNDERPAD 30X36 HEAVY ABSORB (UNDERPADS AND DIAPERS) ×1 IMPLANT

## 2024-05-19 NOTE — Anesthesia Postprocedure Evaluation (Signed)
"   Anesthesia Post Note  Patient: Zaylan Kissoon  Procedure(s) Performed: RIGHT LONG FINGER TRIGGER RELEASE (Right: Middle Finger)     Patient location during evaluation: PACU Anesthesia Type: General Level of consciousness: awake and alert Pain management: pain level controlled Vital Signs Assessment: post-procedure vital signs reviewed and stable Respiratory status: spontaneous breathing, nonlabored ventilation, respiratory function stable and patient connected to nasal cannula oxygen Cardiovascular status: blood pressure returned to baseline and stable Postop Assessment: no apparent nausea or vomiting Anesthetic complications: no   No notable events documented.  Last Vitals:  Vitals:   05/19/24 1414 05/19/24 1425  BP: (!) 145/98 (!) 135/98  Pulse: 77 79  Resp: 17   Temp:  36.6 C  SpO2: 96% 96%    Last Pain:  Vitals:   05/19/24 1425  TempSrc:   PainSc: 0-No pain                 Etherine Mackowiak L Joliana Claflin      "

## 2024-05-19 NOTE — Transfer of Care (Signed)
 Immediate Anesthesia Transfer of Care Note  Patient: Ray Rosales  Procedure(s) Performed: RIGHT LONG FINGER TRIGGER RELEASE (Right: Middle Finger)  Patient Location: PACU  Anesthesia Type:General  Level of Consciousness: drowsy and patient cooperative  Airway & Oxygen Therapy: Patient Spontanous Breathing and Patient connected to nasal cannula oxygen  Post-op Assessment: Report given to RN and Post -op Vital signs reviewed and stable  Post vital signs: Reviewed and stable  Last Vitals:  Vitals Value Taken Time  BP 121/82 05/19/24 13:39  Temp    Pulse 66 05/19/24 13:40  Resp 11 05/19/24 13:40  SpO2 98 % 05/19/24 13:40  Vitals shown include unfiled device data.  Last Pain:  Vitals:   05/19/24 1129  TempSrc: Temporal  PainSc: 0-No pain      Patients Stated Pain Goal: 4 (05/19/24 1129)  Complications: No notable events documented.

## 2024-05-19 NOTE — Anesthesia Preprocedure Evaluation (Addendum)
"                                    Anesthesia Evaluation  Patient identified by MRN, date of birth, ID band Patient awake    Reviewed: Allergy & Precautions, NPO status , Patient's Chart, lab work & pertinent test results  Airway Mallampati: I  TM Distance: >3 FB Neck ROM: Full    Dental  (+) Edentulous Upper, Edentulous Lower, Dental Advisory Given   Pulmonary former smoker   Pulmonary exam normal breath sounds clear to auscultation       Cardiovascular negative cardio ROS Normal cardiovascular exam Rhythm:Regular Rate:Normal     Neuro/Psych  Headaches PSYCHIATRIC DISORDERS Anxiety Depression       GI/Hepatic ,GERD  ,,(+)     substance abuse  marijuana use  Endo/Other  negative endocrine ROS    Renal/GU negative Renal ROS  negative genitourinary   Musculoskeletal negative musculoskeletal ROS (+)    Abdominal   Peds  (+) ADHD Hematology negative hematology ROS (+)   Anesthesia Other Findings   Reproductive/Obstetrics                              Anesthesia Physical Anesthesia Plan  ASA: 2  Anesthesia Plan: General   Post-op Pain Management: Tylenol  PO (pre-op)*   Induction: Intravenous  PONV Risk Score and Plan: 2 and Ondansetron , Dexamethasone  and Midazolam   Airway Management Planned: LMA  Additional Equipment:   Intra-op Plan:   Post-operative Plan: Extubation in OR  Informed Consent: I have reviewed the patients History and Physical, chart, labs and discussed the procedure including the risks, benefits and alternatives for the proposed anesthesia with the patient or authorized representative who has indicated his/her understanding and acceptance.     Dental advisory given  Plan Discussed with: CRNA  Anesthesia Plan Comments:          Anesthesia Quick Evaluation  "

## 2024-05-19 NOTE — Op Note (Addendum)
 05/19/2024 [facility]  Operative Note  PREOPERATIVE DIAGNOSIS: RIGHT LONG FINGER TRIGGER DIGIT  POSTOPERATIVE DIAGNOSIS:  RIGHT LONG FINGER TRIGGER DIGIT  PROCEDURE: Procedures: RIGHT LONG FINGER TRIGGER RELEASE   SURGEON:  Franky Curia, MD  ASSISTANT:  Isaiah Anton, [facility]  ANESTHESIA:  General.  IV FLUIDS:  Per anesthesia flow sheet.  ESTIMATED BLOOD LOSS:  Minimal.  COMPLICATIONS:  None.  SPECIMENS:  None.  TOURNIQUET TIME:  Total Tourniquet Time Documented: Forearm (Right) - 8 minutes Total: Forearm (Right) - 8 minutes   DISPOSITION:  Stable to PACU.  LOCATION: [facility]  INDICATIONS: Ray Rosales is a 49 y.o. male with triggering of the long finger.  He wishes to forego injections.  He wishes to proceed with surgical trigger release.  Risks, benefits and alternatives of surgery were discussed including the risk of blood loss, infection, damage to nerves, vessels, tendons, ligaments, bone, failure of surgery, need for additional surgery, complications with wound healing, continued pain, continued triggering and need for repeat surgery.  He voiced understanding of these risks and elected to proceed.  OPERATIVE COURSE:  After being identified preoperatively by myself, the patient and I agreed upon the procedure and site of procedure.  The surgical site was marked. Surgical consent had been signed.  He was transported to the operating room and placed on the operating room table in supine position with the Right upper extremity on an arm board. General anesthesia was induced by the anesthesiologist.  The Right upper extremity was prepped and draped in normal sterile orthopedic fashion. A surgical pause was performed between surgeons, anesthesia, and operating room staff, and all were in agreement as to the patient, procedure, and site of procedure.  Tourniquet at the proximal aspect of the forearm was inflated to 250 mmHg after exsanguination of  the arm with an Esmarch bandage.  An incision was made at the volar aspect of the MP joint of the long finger.  This was carried into the subcutaneous tissues by spreading technique.  Bipolar electrocautery was used to obtain hemostasis.  The radial and ulnar digital nerves were protected throughout the case. The flexor sheath was identified.  The A1 pulley was identified and sharply incised.  It was released in its entirety.  The proximal 1-2 mm of the A2 pulley was vented to allow better excursion of the tendons.  The finger was placed through a range of motion and there was noted to be no catching.  The tendons were brought through the wound and any adherences released.  The wound was then copiously irrigated with sterile saline. It was closed with 4-0 nylon in a horizontal mattress fashion.  It was injected with 0.25% plain Marcaine  to aid in postoperative analgesia.  It was dressed with sterile Xeroform, 4x4s, and wrapped lightly with a Coban dressing.  Tourniquet was deflated at 8 minutes.  The fingertips were pink with brisk capillary refill after deflation of the tourniquet.  The operative drapes were broken down and the patient was awoken from anesthesia safely.  He was transferred back to the stretcher and taken to the PACU in stable condition.   I will see him back in the office in 1 week for postoperative followup.  I will give him a prescription for Norco 5/325 1 tab PO q6 hours prn pain, dispense #15.    Ray Lheureux, MD Electronically signed, 05/19/2024
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
## 2024-05-19 NOTE — Op Note (Signed)
 SURGERY ASSISTANT NOTE  PLACE OF SERVICE: [facility]   PATIENT INFORMATION: Name: Ray Rosales MRN#: [id number] DOB: [birth year]  Date of surgery: 05/19/2024 Time of surgery: 1:30 PM  SURGERY ASSISTANT NOTE:  Assistant name: Isaiah Anton, PA-C Note date: 05/19/2024  I assisted Dr. Franky Curia on the following procedure(s) for the above-noted patient in the date and time documented:   Right long finger A1 pulley release    I provided assistance on the case as follows:  Assistance with exposure, retraction, bleeding control, protection of vital structures, instrumentation  and closure.   Isaiah Anton, PA-C

## 2024-05-19 NOTE — Discharge Instructions (Addendum)
 Next dose of Tylenol  due anytime after 5:30 today if needed  [facility] Instructions Hand Surgery  Wound Care: Keep your hand elevated above the level of your heart.  Do not allow it to dangle by your side.  Keep the dressing dry and do not remove it unless your doctor advises you to do so.  He will usually change it at the time of your post-op visit.  Moving your fingers is advised to stimulate circulation but will depend on the site of your surgery.  If you have a splint applied, your doctor will advise you regarding movement.  Activity: Do not drive or operate machinery today.  Rest today and then you may return to your normal activity and work as indicated by your physician.  Diet:  Drink liquids today or eat a light diet.  You may resume a regular diet tomorrow.    General expectations: Pain for two to three days. Fingers may become slightly swollen.  Call your doctor if any of the following occur: Severe pain not relieved by pain medication. Elevated temperature. Dressing soaked with blood. Inability to move fingers. White or bluish color to fingers.    Post Anesthesia Home Care Instructions  Activity: Get plenty of rest for the remainder of the day. A responsible individual must stay with you for 24 hours following the procedure.  For the next 24 hours, DO NOT: -Drive a car -Advertising copywriter -Drink alcoholic beverages -Take any medication unless instructed by your physician -Make any legal decisions or sign important papers.  Meals: Start with liquid foods such as gelatin or soup. Progress to regular foods as tolerated. Avoid greasy, spicy, heavy foods. If nausea and/or vomiting occur, drink only clear liquids until the nausea and/or vomiting subsides. Call your physician if vomiting continues.  Special Instructions/Symptoms: Your throat may feel dry or sore from the anesthesia or the breathing tube placed in your throat during surgery. If this causes discomfort,  gargle with warm salt water. The discomfort should disappear within 24 hours.  If you had a scopolamine patch placed behind your ear for the management of post- operative nausea and/or vomiting:  1. The medication in the patch is effective for 72 hours, after which it should be removed.  Wrap patch in a tissue and discard in the trash. Wash hands thoroughly with soap and water. 2. You may remove the patch earlier than 72 hours if you experience unpleasant side effects which may include dry mouth, dizziness or visual disturbances. 3. Avoid touching the patch. Wash your hands with soap and water after contact with the patch.

## 2024-05-19 NOTE — Anesthesia Procedure Notes (Signed)
 Procedure Name: LMA Insertion Date/Time: 05/19/2024 1:11 PM  Performed by: Donnell Berwyn SQUIBB, CRNAPre-anesthesia Checklist: Patient identified, Emergency Drugs available, Suction available, Patient being monitored and Timeout performed Patient Re-evaluated:Patient Re-evaluated prior to induction Oxygen Delivery Method: Circle system utilized Preoxygenation: Pre-oxygenation with 100% oxygen Induction Type: IV induction Ventilation: Mask ventilation without difficulty LMA: LMA inserted LMA Size: 4.0 Number of attempts: 1 Placement Confirmation: positive ETCO2 and breath sounds checked- equal and bilateral Tube secured with: Tape Dental Injury: Teeth and Oropharynx as per pre-operative assessment

## 2024-05-20 ENCOUNTER — Encounter (HOSPITAL_BASED_OUTPATIENT_CLINIC_OR_DEPARTMENT_OTHER): Payer: Self-pay | Admitting: Orthopedic Surgery
